# Patient Record
Sex: Male | Born: 1935
Health system: Southern US, Community
[De-identification: ages and names within clinical notes are randomized; demographics above are authoritative.]

## PROBLEM LIST (undated history)

## (undated) DIAGNOSIS — E785 Hyperlipidemia, unspecified: Secondary | ICD-10-CM

## (undated) DIAGNOSIS — M1990 Unspecified osteoarthritis, unspecified site: Secondary | ICD-10-CM

## (undated) DIAGNOSIS — S68119A Complete traumatic metacarpophalangeal amputation of unspecified finger, initial encounter: Secondary | ICD-10-CM

## (undated) DIAGNOSIS — Z87438 Personal history of other diseases of male genital organs: Secondary | ICD-10-CM

## (undated) DIAGNOSIS — G629 Polyneuropathy, unspecified: Secondary | ICD-10-CM

## (undated) DIAGNOSIS — I10 Essential (primary) hypertension: Secondary | ICD-10-CM

## (undated) DIAGNOSIS — R7303 Prediabetes: Secondary | ICD-10-CM

## (undated) DIAGNOSIS — I69391 Dysphagia following cerebral infarction: Secondary | ICD-10-CM

## (undated) DIAGNOSIS — C61 Malignant neoplasm of prostate: Secondary | ICD-10-CM

## (undated) DIAGNOSIS — C4490 Unspecified malignant neoplasm of skin, unspecified: Secondary | ICD-10-CM

## (undated) HISTORY — PX: FEMUR FRACTURE SURGERY: SHX633

## (undated) HISTORY — PX: KNEE ARTHROSCOPY: SUR90

## (undated) HISTORY — DX: Personal history of other diseases of male genital organs: Z87.438

## (undated) HISTORY — DX: Unspecified osteoarthritis, unspecified site: M19.90

## (undated) HISTORY — PX: OTHER SURGICAL HISTORY: SHX169

## (undated) HISTORY — PX: ROTATOR CUFF REPAIR: SHX139

## (undated) HISTORY — DX: Unspecified malignant neoplasm of skin, unspecified: C44.90

## (undated) HISTORY — DX: Essential (primary) hypertension: I10

## (undated) HISTORY — PX: FINGER AMPUTATION: SHX636

## (undated) HISTORY — DX: Polyneuropathy, unspecified: G62.9

## (undated) HISTORY — PX: HERNIA REPAIR: SHX51

## (undated) HISTORY — DX: Malignant neoplasm of prostate: C61

## (undated) HISTORY — PX: PROSTATE BIOPSY: SHX241

## (undated) HISTORY — DX: Hyperlipidemia, unspecified: E78.5

## (undated) HISTORY — PX: TRANSURETHRAL RESECTION OF PROSTATE: SHX73

## (undated) HISTORY — DX: Prediabetes: R73.03

## (undated) HISTORY — PX: INCISION / DRAINAGE HAND / FINGER: SUR695

## (undated) HISTORY — DX: Dysphagia following cerebral infarction: I69.391

## (undated) HISTORY — DX: Complete traumatic metacarpophalangeal amputation of unspecified finger, initial encounter: S68.119A

## (undated) HISTORY — PX: HYDROCELE EXCISION: SHX482

---

## 2000-04-09 ENCOUNTER — Encounter: Admission: RE | Admit: 2000-04-09 | Discharge: 2000-04-09 | Payer: Self-pay | Admitting: Internal Medicine

## 2000-04-09 ENCOUNTER — Encounter: Payer: Self-pay | Admitting: Internal Medicine

## 2001-05-09 ENCOUNTER — Encounter: Admission: RE | Admit: 2001-05-09 | Discharge: 2001-05-09 | Payer: Self-pay | Admitting: Internal Medicine

## 2001-05-09 ENCOUNTER — Encounter: Payer: Self-pay | Admitting: Internal Medicine

## 2001-12-30 ENCOUNTER — Ambulatory Visit (HOSPITAL_COMMUNITY): Admission: RE | Admit: 2001-12-30 | Discharge: 2001-12-30 | Payer: Self-pay | Admitting: *Deleted

## 2001-12-30 ENCOUNTER — Encounter (INDEPENDENT_AMBULATORY_CARE_PROVIDER_SITE_OTHER): Payer: Self-pay | Admitting: Specialist

## 2002-02-26 ENCOUNTER — Encounter: Admission: RE | Admit: 2002-02-26 | Discharge: 2002-02-26 | Payer: Self-pay | Admitting: Urology

## 2002-02-26 ENCOUNTER — Encounter: Payer: Self-pay | Admitting: Urology

## 2002-02-27 ENCOUNTER — Ambulatory Visit (HOSPITAL_BASED_OUTPATIENT_CLINIC_OR_DEPARTMENT_OTHER): Admission: RE | Admit: 2002-02-27 | Discharge: 2002-02-27 | Payer: Self-pay | Admitting: Urology

## 2004-02-09 ENCOUNTER — Ambulatory Visit (HOSPITAL_COMMUNITY): Admission: RE | Admit: 2004-02-09 | Discharge: 2004-02-09 | Payer: Self-pay | Admitting: Internal Medicine

## 2006-08-01 ENCOUNTER — Encounter: Admission: RE | Admit: 2006-08-01 | Discharge: 2006-08-01 | Payer: Self-pay | Admitting: Internal Medicine

## 2007-11-19 ENCOUNTER — Encounter: Admission: RE | Admit: 2007-11-19 | Discharge: 2007-11-19 | Payer: Self-pay | Admitting: Internal Medicine

## 2009-02-23 ENCOUNTER — Encounter: Admission: RE | Admit: 2009-02-23 | Discharge: 2009-02-23 | Payer: Self-pay | Admitting: Internal Medicine

## 2011-05-12 NOTE — Op Note (Signed)
[facility]  Patient:    Brandon Brown, Brandon Brown Visit Number: [id number] MRN: [id number]          Service Type: NES Location: [facility] Attending Physician:  Monica Becton Dictated by:   Claudette Laws, M.D. Proc. Date: 02/27/02 Admit Date:  02/27/2002                             Operative Report  PREOPERATIVE DIAGNOSIS:  Symptomatic right hydrocele.  POSTOPERATIVE DIAGNOSIS:  Symptomatic right hydrocele.  OPERATION:  Right hydrocelectomy (Lord procedure).  SURGEON:  Claudette Laws, M.D.  DESCRIPTION OF PROCEDURE:  The patient was prepped and draped in the supine position under LMA anesthesia.  A vessel-splitting right hemiscrotal incision was made.  The various scrotal layers were dissected out from the hydrocele sac which was then delivered through the incision.  We then opened the sac, aspirated approximately 100 cc of clear fluid.  Testicle was then brought out through the hydrocele sac.  It was inspected and appeared to be normal with no tumors, normal epididymis.  Then using 3-0 Vicryl, the hydrocele sac was embrocated in a serial fashion, obliterating the sac.  We then injected 20 cc of 0.25% plain Marcaine for a scrotal block, irrigated the wound out with copious amounts of saline, and then placed the testicle back into the hemiscrotum, care being taken not to cause a torsion.  The right hemiscrotum was then closed in layers with 2-0 Vicryl, and then the skin was closed with a 4-0 Monocryl subcuticular stitch.  A fluff dressing along with a scrotal support was applied, and the patient was then taken back to the recovery room in satisfactory condition. Dictated by:   Claudette Laws, M.D. Attending Physician:  Monica Becton DD:  02/27/02 TD:  02/27/02 Job: [id number]

## 2011-08-31 ENCOUNTER — Institutional Professional Consult (permissible substitution): Payer: Self-pay | Admitting: Cardiovascular Disease

## 2013-12-05 ENCOUNTER — Encounter: Payer: Self-pay | Admitting: Radiation Oncology

## 2013-12-05 DIAGNOSIS — C61 Malignant neoplasm of prostate: Secondary | ICD-10-CM | POA: Insufficient documentation

## 2013-12-05 NOTE — Progress Notes (Signed)
GU Location of Tumor / Histology: adenocarcinoma of the prostate  If Prostate Cancer, Gleason Score is (4 + 3=7) and PSA is (4.4 on 09/07/13). Prostate volume 13.4 cc.  Patient presented with elevated PSA and abnormal DRE.  Biopsies of prostate (if applicable) revealed:     Past/Anticipated interventions by urology, if any: TURP  Past/Anticipated interventions by medical oncology, if any: None  Weight changes, if any: None noted  Bowel/Bladder complaints, if any: obstructive symptoms have improved since TURP   Nausea/Vomiting, if any: None noted  Pain issues, if any:  None noted  SAFETY ISSUES:  Prior radiation? NO  Pacemaker/ICD? NO  Possible current pregnancy? NO  Is the patient on methotrexate? NO  Current Complaints / other details:  77 year old male. Dr. Vernie Ammons explained to the patient he is not a good surgical candidate. Ottelin encouraged radioactive seeds.

## 2013-12-07 ENCOUNTER — Encounter: Payer: Self-pay | Admitting: Radiation Oncology

## 2013-12-07 NOTE — Progress Notes (Addendum)
Radiation Oncology         [phone] ________________________________  Initial outpatient Consultation  Name: Brandon Brown MRN: [id number]  Date: 12/08/2013  DOB: [birth year]  CC:No primary provider on file.  Garnett Farm, MD   REFERRING PHYSICIAN: Garnett Farm, MD  DIAGNOSIS: 77 y.o. gentleman with stage T2b adenocarcinoma of the prostate with a Gleason's score of 4+3 and a PSA of 4.4  HISTORY OF PRESENT ILLNESS::Brandon Brown is a 77 y.o. gentleman with a history of BPH s/p TURP in 12/13.  In follow-up with Dr. Teressa Senter in [place], he was noted to have a rising PSA from 3.2 up to 4.4.  He was also noted to have a nodule on DRE.  Dr. Salvatore Decent recommended biopsy, but, the patient asked to get a second opinion from his primary care physician, Dr. Leonia Reader.  Accordingly, he was referred for evaluation in urology by Dr. Vernie Ammons on 10/16/13,  digital rectal examination was performed at that time revealing 1+ gland with a palpable nodule involving the right mid aspect..  The patient proceeded to transrectal ultrasound with 12 biopsies of the prostate on 11/12/12.  The prostate volume measured 13.38 cc.  Of note, his TURP defect was noted on TRUS.  Out of 12 core biopsies, 6 were positive.  The maximum Gleason score was 4+3, and this was seen in the areas displayed below.   The patient reviewed the biopsy results with his urologist and he has kindly been referred today for discussion of potential radiation treatment options.  PREVIOUS RADIATION THERAPY: No  PAST MEDICAL HISTORY:  has a past medical history of Prostate cancer; Hypertension; Hyperlipidemia; and prostatitis.    PAST SURGICAL HISTORY: Past Surgical History  Procedure Laterality Date  . Knee arthroscopy    . Femur fracture surgery    . Incision / drainage hand / finger    . Hernia repair    . Rotator cuff repair    . Prostate biopsy    . Tunica vaginalis excision of hydrocele right    . Transurethral resection of  prostate    . Transurethral resection of prostate      FAMILY HISTORY: family history includes Heart disease in his mother; Non-Hodgkin's lymphoma in his mother.  SOCIAL HISTORY:  reports that he has never smoked. He has never used smokeless tobacco. He reports that he does not drink alcohol or use illicit drugs.  ALLERGIES: Penicillins and Statins  MEDICATIONS:  Current Outpatient Prescriptions  Medication Sig Dispense Refill  . aspirin 81 MG tablet Take 81 mg by mouth daily.      Marland Kitchen lisinopril (PRINIVIL,ZESTRIL) 30 MG tablet Take 40 mg by mouth daily.       . Omega-3 Fatty Acids (FISH OIL) 1000 MG CAPS Take by mouth.      . pravastatin (PRAVACHOL) 20 MG tablet Take 20 mg by mouth daily.      . sildenafil (VIAGRA) 100 MG tablet Take 100 mg by mouth daily as needed for erectile dysfunction.       No current facility-administered medications for this encounter.    REVIEW OF SYSTEMS:  A 15 point review of systems is documented in the electronic medical record. This was obtained by the nursing staff. However, I reviewed this with the patient to discuss relevant findings and make appropriate changes.  A comprehensive review of systems was negative..  The patient completed an IPSS and IIEF questionnaire.  His IPSS score was 5 indicating mild urinary outflow obstructive symptoms.  He indicated that his erectile function is not a high priority in decision making.   PHYSICAL EXAM: This patient is in no acute distress.  He is alert and oriented.   height is 6\' 1"  (1.854 m) and weight is 192 lb (87.091 kg). His oral temperature is 97.6 F (36.4 C). His blood pressure is 158/72 and his pulse is 72. His respiration is 16 and oxygen saturation is 100%.  He exhibits no respiratory distress or labored breathing.  He appears neurologically intact.  His mood is pleasant.  His affect is appropriate.  Please note the digital rectal exam findings described above.  KPS = 100  100 - Normal; no complaints; no  evidence of disease. 90   - Able to carry on normal activity; minor signs or symptoms of disease. 80   - Normal activity with effort; some signs or symptoms of disease. 24   - Cares for self; unable to carry on normal activity or to do active work. 60   - Requires occasional assistance, but is able to care for most of his personal needs. 50   - Requires considerable assistance and frequent medical care. 40   - Disabled; requires special care and assistance. 30   - Severely disabled; hospital admission is indicated although death not imminent. 20   - Very sick; hospital admission necessary; active supportive treatment necessary. 10   - Moribund; fatal processes progressing rapidly. 0     - Dead  Karnofsky DA, Abelmann WH, Craver LS and Burchenal JH [phone]) The use of the nitrogen mustards in the palliative treatment of carcinoma: with particular reference to bronchogenic carcinoma Cancer 1 634-56   LABORATORY DATA:  No results found for this basename: WBC,  HGB,  HCT,  MCV,  PLT   No results found for this basename: NA,  K,  CL,  CO2   No results found for this basename: ALT,  AST,  GGT,  ALKPHOS,  BILITOT     RADIOGRAPHY: No results found.    IMPRESSION: This gentleman is a 77 y.o. gentleman with stage T2b adenocarcinoma of the prostate with a Gleason's score of 4+3 and a PSA of 4.4.  His T-Stage, Gleason's Score, and PSA put him into the higher risk intermediate risk group.  Accordingly he is eligible for a variety of potential treatment options including androgen deprivation with radiotherapy with or without seed boost.  In general, I tend to recommend seed implant boost to achieve a higher dose levels then in intensity modulated radiotherapy can achieve. However, from a technical perspective, I do have a few reservations about prostate seed implant for [name]. Specifically, the volume of the patient's gland is less than 15 cc which can create challenges in designing a high quality seed  implant with proper coverage of the entire prostate while sparing the urethra and rectum. My second reservation is the patient's previous TURP procedure. While most literature suggests that seed implant more than 6 months following TURP does not increase risk of incontinence, I am concerned that the TURP defect remains visible on transrectal ultrasound further decreasing the volume of prostate parenchyma available for seed placement. Neither of these 2 concerns represent contraindications, so I need to discuss with the patient the pros and cons of external radiation using seed implant boost versus intensity modulated radiotherapy alone.Marland Kitchen  PLAN:Today I reviewed the findings and workup thus far.  We discussed the natural history of prostate cancer.  We reviewed the the implications of T-stage, Gleason's Score,  and PSA on decision-making and outcomes in prostate cancer.  We discussed radiation treatment in the management of prostate cancer with regard to the logistics and delivery of external beam radiation treatment as well as the logistics and delivery of prostate brachytherapy.  We compared and contrasted each of these approaches and also compared these against prostatectomy.  The patient expressed interest in external beam radiotherapy.  I filled out a patient counseling form for him with relevant treatment diagrams and we retained a copy for our records.   The patient would like to proceed with prostate IMRT.  I will share my findings with Dr. Vernie Ammons and move forward with scheduling hormone therapy followed by placement of three gold fiducial markers into the prostate to proceed with IMRT in the near future.     I enjoyed meeting with him today, and will look forward to participating in the care of this very nice gentleman.   I spent 60 minutes face to face with the patient and more than 50% of that time was spent in counseling and/or coordination of care.     ------------------------------------------------  Artist Pais. Kathrynn Running, M.D.

## 2013-12-08 ENCOUNTER — Encounter: Payer: Self-pay | Admitting: Radiation Oncology

## 2013-12-08 ENCOUNTER — Ambulatory Visit
Admission: RE | Admit: 2013-12-08 | Discharge: 2013-12-08 | Disposition: A | Discharge status: Home / Self Care | Payer: 59 | Source: Ambulatory Visit | Attending: Radiation Oncology | Admitting: Radiation Oncology

## 2013-12-08 ENCOUNTER — Telehealth: Payer: Self-pay | Admitting: *Deleted

## 2013-12-08 ENCOUNTER — Ambulatory Visit
Admission: RE | Admit: 2013-12-08 | Discharge: 2013-12-08 | Disposition: A | Discharge status: Home / Self Care | Payer: Medicare Other | Source: Ambulatory Visit | Attending: Radiation Oncology | Admitting: Radiation Oncology

## 2013-12-08 VITALS — BP 158/72 | HR 72 | Temp 97.6°F | Resp 16 | Ht 73.0 in | Wt 192.0 lb

## 2013-12-08 DIAGNOSIS — C61 Malignant neoplasm of prostate: Secondary | ICD-10-CM | POA: Insufficient documentation

## 2013-12-08 DIAGNOSIS — I10 Essential (primary) hypertension: Secondary | ICD-10-CM | POA: Insufficient documentation

## 2013-12-08 DIAGNOSIS — Z807 Family history of other malignant neoplasms of lymphoid, hematopoietic and related tissues: Secondary | ICD-10-CM | POA: Insufficient documentation

## 2013-12-08 DIAGNOSIS — E785 Hyperlipidemia, unspecified: Secondary | ICD-10-CM | POA: Insufficient documentation

## 2013-12-08 DIAGNOSIS — Z79899 Other long term (current) drug therapy: Secondary | ICD-10-CM | POA: Insufficient documentation

## 2013-12-08 NOTE — Addendum Note (Signed)
Encounter addended by: Oneita Hurt, MD on: 12/08/2013 10:28 AM<BR>     Documentation filed: Clinical Notes, Notes Section

## 2013-12-08 NOTE — Progress Notes (Signed)
See progress note under physician encounter. 

## 2013-12-08 NOTE — Addendum Note (Signed)
Encounter addended by: Oneita Hurt, MD on: 12/08/2013 10:30 AM<BR>     Documentation filed: Clinical Notes

## 2013-12-08 NOTE — Progress Notes (Signed)
IPSS of 5 noted. Reports on average he void once during the night. Patient denies dysuria or hematuria. States, "however, if I drink a beer and urinate i do feel a little burning sensation." Denies urgency or incontinence. Denies unintentional weight loss or decreased appetite. Denies fatigue. Reports that he works out at [employer] several times a week. Patient is a former football, Writer. Denies diarrhea. Denies pain associated with bowel movements or blood in stool.

## 2013-12-08 NOTE — Progress Notes (Signed)
Complete PATIENT MEASURE OF DISTRESS worksheet with a score of 8 submitted to social work.

## 2013-12-08 NOTE — Telephone Encounter (Signed)
Called patient to inform of hormone shot for 12-09-13, and his gold seed placement on 02-09-14 at 1:15 pm at Dr. Margrett Rud [facility] and his sim on 02/20/14 at 9:00 am at Dr. Broadus John [facility], spoke with patient and he is aware of these appts.

## 2013-12-10 ENCOUNTER — Ambulatory Visit: Payer: Medicare Other

## 2013-12-10 ENCOUNTER — Ambulatory Visit: Payer: Medicare Other | Admitting: Radiation Oncology

## 2013-12-22 ENCOUNTER — Encounter: Payer: Self-pay | Admitting: *Deleted

## 2013-12-22 NOTE — Progress Notes (Addendum)
[facility] Psychosocial Distress Screening  Clinical Social Work  Clinical Social Work was referred by distress screening protocol. The patient scored a 8 on the Psychosocial Distress Thermometer which indicates severe distress. Clinical Social Worker attempted to contact patient by phone to assess for distress and other psychosocial needs. CSW unable to reach patient, left voicemail to return call when convenient.   Patient returned CSW phone call.  Mr. Capri states he is feeling confident in "next steps", he discussed feeling anxious about new treatment and uncertainty (not knowing what to expect).  CSW validated patient's feelings and encouraged him to attend the prostate cancer support group.  CSW discussed support services at center.  Patient was very appreciative of phone contact and plans to follow up with CSW as needed.  Kathrin Penner, MSW, LCSW, OSW-C  Clinical Social Worker  [facility]  [phone]

## 2014-01-27 ENCOUNTER — Telehealth: Payer: Self-pay | Admitting: Radiation Oncology

## 2014-01-27 NOTE — Telephone Encounter (Signed)
Placed purple folder with patient's daughter's FMLA form in Dr. Johny Shears inbox for signature.

## 2014-02-03 ENCOUNTER — Encounter: Payer: Self-pay | Admitting: Radiation Oncology

## 2014-02-03 NOTE — Progress Notes (Signed)
Faxed FMLA paperwork - scanned.

## 2014-02-20 ENCOUNTER — Ambulatory Visit
Admission: RE | Admit: 2014-02-20 | Discharge: 2014-02-20 | Disposition: A | Discharge status: Home / Self Care | Payer: Medicare Other | Source: Ambulatory Visit | Attending: Radiation Oncology | Admitting: Radiation Oncology

## 2014-02-20 DIAGNOSIS — C61 Malignant neoplasm of prostate: Secondary | ICD-10-CM | POA: Insufficient documentation

## 2014-02-20 DIAGNOSIS — Z510 Encounter for antineoplastic radiation therapy: Secondary | ICD-10-CM | POA: Insufficient documentation

## 2014-02-20 DIAGNOSIS — Z79899 Other long term (current) drug therapy: Secondary | ICD-10-CM | POA: Insufficient documentation

## 2014-02-20 DIAGNOSIS — Z7982 Long term (current) use of aspirin: Secondary | ICD-10-CM | POA: Insufficient documentation

## 2014-02-21 NOTE — Progress Notes (Signed)
  Radiation Oncology         [phone] ________________________________  Name: Brandon Brown MRN: [id number]  Date: 02/20/2014  DOB: [birth year]  SIMULATION AND TREATMENT PLANNING NOTE  DIAGNOSIS:  78 y.o. gentleman with stage T2b adenocarcinoma of the prostate with a Gleason's score of 4+3 and a PSA of 4.4  NARRATIVE:  The patient was brought to the [place] [facility].  Identity was confirmed.  All relevant records and images related to the planned course of therapy were reviewed.  The patient freely provided informed written consent to proceed with treatment after reviewing the details related to the planned course of therapy. The consent form was witnessed and verified by the simulation staff.  Then, the patient was set-up in a stable reproducible supine position for radiation therapy.  A vacuum lock pillow device was custom fabricated to position his legs in a reproducible immobilized position.  Then, I performed a urethrogram under sterile conditions to identify the prostatic apex.  CT images were obtained.  Surface markings were placed.  The CT images were loaded into the planning software.  Then the prostate target and avoidance structures including the rectum, bladder, bowel and hips were contoured.  Treatment planning then occurred.  The radiation prescription was entered and confirmed.  A total of one complex treatment devices were fabricated in the form of a bodyFix leg mold. I have requested : Intensity Modulated Radiotherapy (IMRT) is medically necessary for this case for the following reason:  Rectal sparing.Marland Kitchen  PLAN:  The patient will receive 78 Gy in 40 fractions.  ________________________________  Sheral Apley Tammi Klippel, M.D.

## 2014-03-02 ENCOUNTER — Ambulatory Visit
Admission: RE | Admit: 2014-03-02 | Discharge: 2014-03-02 | Disposition: A | Discharge status: Home / Self Care | Payer: Medicare Other | Source: Ambulatory Visit | Attending: Radiation Oncology | Admitting: Radiation Oncology

## 2014-03-02 DIAGNOSIS — C61 Malignant neoplasm of prostate: Secondary | ICD-10-CM

## 2014-03-03 ENCOUNTER — Ambulatory Visit
Admission: RE | Admit: 2014-03-03 | Discharge: 2014-03-03 | Disposition: A | Discharge status: Home / Self Care | Payer: Medicare Other | Source: Ambulatory Visit | Attending: Radiation Oncology | Admitting: Radiation Oncology

## 2014-03-04 ENCOUNTER — Ambulatory Visit
Admission: RE | Admit: 2014-03-04 | Discharge: 2014-03-04 | Disposition: A | Discharge status: Home / Self Care | Payer: Medicare Other | Source: Ambulatory Visit | Attending: Radiation Oncology | Admitting: Radiation Oncology

## 2014-03-05 ENCOUNTER — Ambulatory Visit
Admission: RE | Admit: 2014-03-05 | Discharge: 2014-03-05 | Disposition: A | Discharge status: Home / Self Care | Payer: Medicare Other | Source: Ambulatory Visit | Attending: Radiation Oncology | Admitting: Radiation Oncology

## 2014-03-06 ENCOUNTER — Ambulatory Visit
Admission: RE | Admit: 2014-03-06 | Discharge: 2014-03-06 | Disposition: A | Discharge status: Home / Self Care | Payer: Medicare Other | Source: Ambulatory Visit | Attending: Radiation Oncology | Admitting: Radiation Oncology

## 2014-03-06 VITALS — BP 182/89 | HR 78 | Temp 97.9°F | Ht 73.0 in | Wt 195.0 lb

## 2014-03-06 DIAGNOSIS — C61 Malignant neoplasm of prostate: Secondary | ICD-10-CM

## 2014-03-06 NOTE — Progress Notes (Signed)
Brandon Brown has had 5 fractions to his prostate.  He denies pain.  His bp is elevated today at 182/89.  He says he gets nervous when he is here.  He denies hematuria and dysuria.  He reports urinating usually 2 times per night.  He reports hot flashes at night.  He had a script for megace but did not take it due to the side effects.  He was given the Radiation Therapy and You book and discussed side effects/managent of fatigue, diarrhea, skin changes and bladder changes.

## 2014-03-07 ENCOUNTER — Encounter: Payer: Self-pay | Admitting: Radiation Oncology

## 2014-03-07 NOTE — Progress Notes (Signed)
  Radiation Oncology         [phone] ________________________________  Name: Brandon Brown MRN: [id number]  Date: 03/06/2014  DOB: 05-26-1936  Weekly Radiation Therapy Management  Current Dose: 9.75 Gy     Planned Dose:  78 Gy  Narrative . . . . . . . . The patient presents for routine under treatment assessment.                                   The patient is without complaint.                                 Set-up films were reviewed.                                 The chart was checked. Physical Findings. . .  height is 6\' 1"  (1.854 m) and weight is 195 lb (88.451 kg). His temperature is 97.9 F (36.6 C). His blood pressure is 182/89 and his pulse is 78. . Weight essentially stable.  No significant changes. Impression . . . . . . . The patient is tolerating radiation. Plan . . . . . . . . . . . . Continue treatment as planned.  ________________________________  Sheral Apley. Tammi Klippel, M.D.

## 2014-03-09 ENCOUNTER — Ambulatory Visit
Admission: RE | Admit: 2014-03-09 | Discharge: 2014-03-09 | Disposition: A | Discharge status: Home / Self Care | Payer: Medicare Other | Source: Ambulatory Visit | Attending: Radiation Oncology | Admitting: Radiation Oncology

## 2014-03-10 ENCOUNTER — Ambulatory Visit
Admission: RE | Admit: 2014-03-10 | Discharge: 2014-03-10 | Disposition: A | Discharge status: Home / Self Care | Payer: Medicare Other | Source: Ambulatory Visit | Attending: Radiation Oncology | Admitting: Radiation Oncology

## 2014-03-11 ENCOUNTER — Ambulatory Visit
Admission: RE | Admit: 2014-03-11 | Discharge: 2014-03-11 | Disposition: A | Discharge status: Home / Self Care | Payer: Medicare Other | Source: Ambulatory Visit | Attending: Radiation Oncology | Admitting: Radiation Oncology

## 2014-03-12 ENCOUNTER — Encounter: Payer: Self-pay | Admitting: Radiation Oncology

## 2014-03-12 ENCOUNTER — Ambulatory Visit
Admission: RE | Admit: 2014-03-12 | Discharge: 2014-03-12 | Disposition: A | Discharge status: Home / Self Care | Payer: Medicare Other | Source: Ambulatory Visit | Attending: Radiation Oncology | Admitting: Radiation Oncology

## 2014-03-12 VITALS — BP 136/74 | HR 66 | Temp 98.3°F | Resp 20 | Wt 197.1 lb

## 2014-03-12 DIAGNOSIS — C61 Malignant neoplasm of prostate: Secondary | ICD-10-CM

## 2014-03-12 NOTE — Progress Notes (Signed)
Pt denies pain, urinary/bowel issues, fatigue, loss of appetite. 

## 2014-03-13 ENCOUNTER — Encounter: Payer: Self-pay | Admitting: Radiation Oncology

## 2014-03-13 ENCOUNTER — Ambulatory Visit
Admission: RE | Admit: 2014-03-13 | Discharge: 2014-03-13 | Disposition: A | Discharge status: Home / Self Care | Payer: Medicare Other | Source: Ambulatory Visit | Attending: Radiation Oncology | Admitting: Radiation Oncology

## 2014-03-13 NOTE — Progress Notes (Signed)
  Radiation Oncology         [phone] ________________________________  Name: Brandon Brown MRN: [id number]  Date: 03/12/2014  DOB: 1936-08-08  Weekly Radiation Therapy Management  Current Dose: 15.6 Gy     Planned Dose:  78 Gy  Narrative . . . . . . . . The patient presents for routine under treatment assessment.                                   The patient is without complaint.                                 Set-up films were reviewed.                                 The chart was checked. Physical Findings. . .  weight is 197 lb 1.6 oz (89.404 kg). His oral temperature is 98.3 F (36.8 C). His blood pressure is 136/74 and his pulse is 66. His respiration is 20. . Weight essentially stable.  No significant changes. Impression . . . . . . . The patient is tolerating radiation. Plan . . . . . . . . . . . . Continue treatment as planned.  ________________________________  Sheral Apley. Tammi Klippel, M.D.

## 2014-03-16 ENCOUNTER — Ambulatory Visit
Admission: RE | Admit: 2014-03-16 | Discharge: 2014-03-16 | Disposition: A | Discharge status: Home / Self Care | Payer: Medicare Other | Source: Ambulatory Visit | Attending: Radiation Oncology | Admitting: Radiation Oncology

## 2014-03-17 ENCOUNTER — Ambulatory Visit
Admission: RE | Admit: 2014-03-17 | Discharge: 2014-03-17 | Disposition: A | Discharge status: Home / Self Care | Payer: Medicare Other | Source: Ambulatory Visit | Attending: Radiation Oncology | Admitting: Radiation Oncology

## 2014-03-18 ENCOUNTER — Ambulatory Visit
Admission: RE | Admit: 2014-03-18 | Discharge: 2014-03-18 | Disposition: A | Discharge status: Home / Self Care | Payer: Medicare Other | Source: Ambulatory Visit | Attending: Radiation Oncology | Admitting: Radiation Oncology

## 2014-03-19 ENCOUNTER — Encounter: Payer: Self-pay | Admitting: Radiation Oncology

## 2014-03-19 ENCOUNTER — Ambulatory Visit
Admission: RE | Admit: 2014-03-19 | Discharge: 2014-03-19 | Disposition: A | Discharge status: Home / Self Care | Payer: Medicare Other | Source: Ambulatory Visit | Attending: Radiation Oncology | Admitting: Radiation Oncology

## 2014-03-19 NOTE — Progress Notes (Signed)
  Radiation Oncology         [phone] ________________________________  Name: BRADDEN TADROS MRN: [id number]  Date: 03/20/2014  DOB: 1936/09/11  Weekly Radiation Therapy Management  Current Dose: 29.95 Gy     Planned Dose:  78 Gy  Narrative . . . . . . . . The patient presents for routine under treatment assessment.                                 Weight stable. Reports an excellent appetite. Reports an occasional twinge but, is unsure if it prostate or rectal. Denies dysuria or hematuria. Denies diarrhea. Denies fatigue. Reports a steady urine stream.                                 Set-up films were reviewed.                                 The chart was checked. Physical Findings. . .  weight is 197 lb 8 oz (89.585 kg). His blood pressure is 136/77 and his pulse is 73. His respiration is 18. . Weight essentially stable.  No significant changes. Impression . . . . . . . The patient is tolerating radiation. Plan . . . . . . . . . . . . Continue treatment as planned.  ________________________________  Sheral Apley. Tammi Klippel, M.D.

## 2014-03-20 ENCOUNTER — Ambulatory Visit
Admission: RE | Admit: 2014-03-20 | Discharge: 2014-03-20 | Disposition: A | Discharge status: Home / Self Care | Payer: Medicare Other | Source: Ambulatory Visit | Attending: Radiation Oncology | Admitting: Radiation Oncology

## 2014-03-20 ENCOUNTER — Encounter: Payer: Self-pay | Admitting: Radiation Oncology

## 2014-03-20 VITALS — BP 136/77 | HR 73 | Resp 18 | Wt 197.5 lb

## 2014-03-20 DIAGNOSIS — C61 Malignant neoplasm of prostate: Secondary | ICD-10-CM

## 2014-03-20 NOTE — Progress Notes (Signed)
Weight stable. Reports an excellent appetite. Reports an occasional twinge but, is unsure if it prostate or rectal. Denies dysuria or hematuria. Denies diarrhea. Denies fatigue. Reports a steady urine stream.

## 2014-03-23 ENCOUNTER — Ambulatory Visit
Admission: RE | Admit: 2014-03-23 | Discharge: 2014-03-23 | Disposition: A | Discharge status: Home / Self Care | Payer: Medicare Other | Source: Ambulatory Visit | Attending: Radiation Oncology | Admitting: Radiation Oncology

## 2014-03-24 ENCOUNTER — Ambulatory Visit
Admission: RE | Admit: 2014-03-24 | Discharge: 2014-03-24 | Disposition: A | Discharge status: Home / Self Care | Payer: Medicare Other | Source: Ambulatory Visit | Attending: Radiation Oncology | Admitting: Radiation Oncology

## 2014-03-24 ENCOUNTER — Encounter: Payer: Self-pay | Admitting: Radiation Oncology

## 2014-03-24 VITALS — BP 108/73 | HR 104 | Resp 16 | Wt 196.0 lb

## 2014-03-24 DIAGNOSIS — C61 Malignant neoplasm of prostate: Secondary | ICD-10-CM

## 2014-03-24 NOTE — Progress Notes (Addendum)
Weight stable. Reports an excellent appetite. Reports an occasional twinge but, is unsure if it prostate or rectal. Denies dysuria. Reports scant blood on the tissue when he wiped following a bowel movement. Denies diarrhea. Denies fatigue. Reports a steady urine stream.

## 2014-03-24 NOTE — Progress Notes (Signed)
Weekly Management Note Current Dose:  33.15 Gy  Projected Dose: 78 Gy   Narrative:  The patient presents for routine under treatment assessment.  CBCT/MVCT images/Port film x-rays were reviewed.  The chart was checked. Stable urinary symptoms. No diarrhea. Energy levels good.  Physical Findings: Weight: 196 lb (88.905 kg). Alert and oriented.  Impression:  The patient is tolerating radiation.  Plan:  Continue treatment as planned.

## 2014-03-25 ENCOUNTER — Ambulatory Visit
Admission: RE | Admit: 2014-03-25 | Discharge: 2014-03-25 | Disposition: A | Discharge status: Home / Self Care | Payer: Medicare Other | Source: Ambulatory Visit | Attending: Radiation Oncology | Admitting: Radiation Oncology

## 2014-03-26 ENCOUNTER — Ambulatory Visit
Admission: RE | Admit: 2014-03-26 | Discharge: 2014-03-26 | Disposition: A | Discharge status: Home / Self Care | Payer: Medicare Other | Source: Ambulatory Visit | Attending: Radiation Oncology | Admitting: Radiation Oncology

## 2014-03-27 ENCOUNTER — Ambulatory Visit
Admission: RE | Admit: 2014-03-27 | Discharge: 2014-03-27 | Disposition: A | Discharge status: Home / Self Care | Payer: Medicare Other | Source: Ambulatory Visit | Attending: Radiation Oncology | Admitting: Radiation Oncology

## 2014-03-30 ENCOUNTER — Ambulatory Visit
Admission: RE | Admit: 2014-03-30 | Discharge: 2014-03-30 | Disposition: A | Discharge status: Home / Self Care | Payer: Medicare Other | Source: Ambulatory Visit | Attending: Radiation Oncology | Admitting: Radiation Oncology

## 2014-03-31 ENCOUNTER — Ambulatory Visit
Admission: RE | Admit: 2014-03-31 | Discharge: 2014-03-31 | Disposition: A | Discharge status: Home / Self Care | Payer: Medicare Other | Source: Ambulatory Visit | Attending: Radiation Oncology | Admitting: Radiation Oncology

## 2014-04-01 ENCOUNTER — Ambulatory Visit
Admission: RE | Admit: 2014-04-01 | Discharge: 2014-04-01 | Disposition: A | Discharge status: Home / Self Care | Payer: Medicare Other | Source: Ambulatory Visit | Attending: Radiation Oncology | Admitting: Radiation Oncology

## 2014-04-02 ENCOUNTER — Ambulatory Visit
Admission: RE | Admit: 2014-04-02 | Discharge: 2014-04-02 | Disposition: A | Discharge status: Home / Self Care | Payer: Medicare Other | Source: Ambulatory Visit | Attending: Radiation Oncology | Admitting: Radiation Oncology

## 2014-04-03 ENCOUNTER — Ambulatory Visit: Payer: Medicare Other | Admitting: Radiation Oncology

## 2014-04-03 ENCOUNTER — Ambulatory Visit: Payer: Medicare Other

## 2014-04-06 ENCOUNTER — Ambulatory Visit
Admission: RE | Admit: 2014-04-06 | Discharge: 2014-04-06 | Disposition: A | Discharge status: Home / Self Care | Payer: Medicare Other | Source: Ambulatory Visit | Attending: Radiation Oncology | Admitting: Radiation Oncology

## 2014-04-06 ENCOUNTER — Encounter: Payer: Self-pay | Admitting: Radiation Oncology

## 2014-04-06 VITALS — BP 149/63 | HR 91 | Temp 97.8°F | Ht 73.0 in | Wt 197.2 lb

## 2014-04-06 DIAGNOSIS — C61 Malignant neoplasm of prostate: Secondary | ICD-10-CM

## 2014-04-06 NOTE — Progress Notes (Signed)
  Radiation Oncology         [phone] ________________________________  Name: Brandon Brown MRN: [id number]  Date: 04/06/2014  DOB: 1936-11-24  Weekly Radiation Therapy Management  Current Fraction: 25     Planned Fractions:  40  Narrative . . . . . . . . The patient presents for routine under treatment assessment.                                   The patient is without complaint.                                 Set-up films were reviewed.                                 The chart was checked. Physical Findings. . .  height is 6\' 1"  (1.854 m) and weight is 197 lb 3.2 oz (89.449 kg). His temperature is 97.8 F (36.6 C). His blood pressure is 149/63 and his pulse is 91. . Weight essentially stable.  No significant changes. Impression . . . . . . . The patient is tolerating radiation. Plan . . . . . . . . . . . . Continue treatment as planned.  ________________________________  Sheral Apley. Tammi Klippel, M.D.

## 2014-04-06 NOTE — Progress Notes (Addendum)
Brandon Brown has received 25 fractions to his pelvis for prostate cancer.  He denies any frequency, urgency, dysuria, proctitis, nor diarrhea.  No other voiced concerns.

## 2014-04-07 ENCOUNTER — Ambulatory Visit
Admission: RE | Admit: 2014-04-07 | Discharge: 2014-04-07 | Disposition: A | Discharge status: Home / Self Care | Payer: Medicare Other | Source: Ambulatory Visit | Attending: Radiation Oncology | Admitting: Radiation Oncology

## 2014-04-08 ENCOUNTER — Ambulatory Visit
Admission: RE | Admit: 2014-04-08 | Discharge: 2014-04-08 | Disposition: A | Discharge status: Home / Self Care | Payer: Medicare Other | Source: Ambulatory Visit | Attending: Radiation Oncology | Admitting: Radiation Oncology

## 2014-04-09 ENCOUNTER — Encounter: Payer: Self-pay | Admitting: Radiation Oncology

## 2014-04-09 ENCOUNTER — Ambulatory Visit
Admission: RE | Admit: 2014-04-09 | Discharge: 2014-04-09 | Disposition: A | Discharge status: Home / Self Care | Payer: Medicare Other | Source: Ambulatory Visit | Attending: Radiation Oncology | Admitting: Radiation Oncology

## 2014-04-09 VITALS — BP 154/67 | HR 79 | Temp 97.8°F | Wt 199.4 lb

## 2014-04-09 DIAGNOSIS — C61 Malignant neoplasm of prostate: Secondary | ICD-10-CM

## 2014-04-09 NOTE — Progress Notes (Signed)
Patient for reassessment in new block this week. Completed 28 of 40 treatments of pelvis for prostate cancer.Has normal urine flow during the day and had nocturia up  3 times last night.denies pain.does feel warm sensation over pelvis at times.

## 2014-04-09 NOTE — Progress Notes (Signed)
  Radiation Oncology         [phone] ________________________________  Name: Brandon VALLETTA MRN: [id number]  Date: 04/09/2014  DOB: 05-06-1936  Weekly Radiation Therapy Management  Current Dose: 54.6 Gy     Planned Dose:  78 Gy  Narrative . . . . . . . . The patient presents for routine under treatment assessment.                                   Patient for reassessment in new block this week. Completed 28 of 40 treatments of pelvis for prostate cancer.Has normal urine flow during the day and had nocturia up 3 times last night.denies pain.does feel warm sensation over pelvis at times.                                   Set-up films were reviewed.                                 The chart was checked. Physical Findings. . .  weight is 199 lb 6.4 oz (90.447 kg). His temperature is 97.8 F (36.6 C). His blood pressure is 154/67 and his pulse is 79. . Weight essentially stable.  No significant changes. Impression . . . . . . . The patient is tolerating radiation. Plan . . . . . . . . . . . . Continue treatment as planned.  ________________________________  Sheral Apley. Tammi Klippel, M.D.

## 2014-04-10 ENCOUNTER — Ambulatory Visit
Admission: RE | Admit: 2014-04-10 | Discharge: 2014-04-10 | Disposition: A | Discharge status: Home / Self Care | Payer: Medicare Other | Source: Ambulatory Visit | Attending: Radiation Oncology | Admitting: Radiation Oncology

## 2014-04-13 ENCOUNTER — Ambulatory Visit
Admission: RE | Admit: 2014-04-13 | Discharge: 2014-04-13 | Disposition: A | Discharge status: Home / Self Care | Payer: Medicare Other | Source: Ambulatory Visit | Attending: Radiation Oncology | Admitting: Radiation Oncology

## 2014-04-14 ENCOUNTER — Ambulatory Visit
Admission: RE | Admit: 2014-04-14 | Discharge: 2014-04-14 | Disposition: A | Discharge status: Home / Self Care | Payer: Medicare Other | Source: Ambulatory Visit | Attending: Radiation Oncology | Admitting: Radiation Oncology

## 2014-04-15 ENCOUNTER — Ambulatory Visit
Admission: RE | Admit: 2014-04-15 | Discharge: 2014-04-15 | Disposition: A | Discharge status: Home / Self Care | Payer: Medicare Other | Source: Ambulatory Visit | Attending: Radiation Oncology | Admitting: Radiation Oncology

## 2014-04-16 ENCOUNTER — Ambulatory Visit
Admission: RE | Admit: 2014-04-16 | Discharge: 2014-04-16 | Disposition: A | Discharge status: Home / Self Care | Payer: Medicare Other | Source: Ambulatory Visit | Attending: Radiation Oncology | Admitting: Radiation Oncology

## 2014-04-16 ENCOUNTER — Encounter: Payer: Self-pay | Admitting: Radiation Oncology

## 2014-04-16 DIAGNOSIS — C61 Malignant neoplasm of prostate: Secondary | ICD-10-CM

## 2014-04-17 ENCOUNTER — Ambulatory Visit: Payer: Medicare Other

## 2014-04-20 ENCOUNTER — Ambulatory Visit
Admission: RE | Admit: 2014-04-20 | Discharge: 2014-04-20 | Disposition: A | Discharge status: Home / Self Care | Payer: Medicare Other | Source: Ambulatory Visit | Attending: Radiation Oncology | Admitting: Radiation Oncology

## 2014-04-20 ENCOUNTER — Encounter: Payer: Self-pay | Admitting: Radiation Oncology

## 2014-04-20 VITALS — BP 147/72 | HR 65 | Resp 16 | Wt 201.6 lb

## 2014-04-20 DIAGNOSIS — C61 Malignant neoplasm of prostate: Secondary | ICD-10-CM

## 2014-04-20 NOTE — Progress Notes (Signed)
   Department of Radiation Oncology  Phone:  [phone] Fax:        [phone]  Weekly Treatment Note    Name: Brandon Brown Date: 04/20/2014 MRN: [id number] DOB: 1936-10-08   Current dose: 66.3 Gy  Current fraction: 34   MEDICATIONS: Current Outpatient Prescriptions  Medication Sig Dispense Refill  . aspirin 81 MG tablet Take 81 mg by mouth daily.      Marland Kitchen lisinopril (PRINIVIL,ZESTRIL) 30 MG tablet Take 40 mg by mouth daily.       . Omega-3 Fatty Acids (FISH OIL) 1000 MG CAPS Take by mouth.      . pravastatin (PRAVACHOL) 20 MG tablet       . sildenafil (VIAGRA) 100 MG tablet Take 100 mg by mouth daily as needed for erectile dysfunction.       No current facility-administered medications for this encounter.     ALLERGIES: Penicillins and Statins   LABORATORY DATA:  No results found for this basename: WBC, HGB, HCT, MCV, PLT   No results found for this basename: NA, K, CL, CO2   No results found for this basename: ALT, AST, GGT, ALKPHOS, BILITOT     NARRATIVE: Brandon Brown was seen today for weekly treatment management. The chart was checked and the patient's films were reviewed. The patient is doing relatively well. He reports mild fatigue. He had nocturia x4 last night says that he drank a lot of fluid last night. This has been atypical and he has not had anything close to this level of nocturia the remainder of the week. He does have some urinary burning, no major change.  PHYSICAL EXAMINATION: weight is 201 lb 9.6 oz (91.445 kg). His blood pressure is 147/72 and his pulse is 65. His respiration is 16.        ASSESSMENT: The patient is doing satisfactorily with treatment.  PLAN: We will continue with the patient's radiation treatment as planned.

## 2014-04-20 NOTE — Progress Notes (Signed)
Reports nocturia x 4. Reports persistent burning in pelvis region but, no dysuria. Denies diarrhea. Denies incontinence. Reports mild fatigue. Vital stable. Weight stable.

## 2014-04-21 ENCOUNTER — Ambulatory Visit
Admission: RE | Admit: 2014-04-21 | Discharge: 2014-04-21 | Disposition: A | Discharge status: Home / Self Care | Payer: Medicare Other | Source: Ambulatory Visit | Attending: Radiation Oncology | Admitting: Radiation Oncology

## 2014-04-22 ENCOUNTER — Ambulatory Visit
Admission: RE | Admit: 2014-04-22 | Discharge: 2014-04-22 | Disposition: A | Discharge status: Home / Self Care | Payer: Medicare Other | Source: Ambulatory Visit | Attending: Radiation Oncology | Admitting: Radiation Oncology

## 2014-04-23 ENCOUNTER — Ambulatory Visit
Admission: RE | Admit: 2014-04-23 | Discharge: 2014-04-23 | Disposition: A | Discharge status: Home / Self Care | Payer: Medicare Other | Source: Ambulatory Visit | Attending: Radiation Oncology | Admitting: Radiation Oncology

## 2014-04-24 ENCOUNTER — Ambulatory Visit
Admission: RE | Admit: 2014-04-24 | Discharge: 2014-04-24 | Disposition: A | Discharge status: Home / Self Care | Payer: Medicare Other | Source: Ambulatory Visit | Attending: Radiation Oncology | Admitting: Radiation Oncology

## 2014-04-24 ENCOUNTER — Ambulatory Visit: Payer: Medicare Other

## 2014-04-24 ENCOUNTER — Ambulatory Visit: Payer: Medicare Other | Admitting: Radiation Oncology

## 2014-04-24 VITALS — BP 150/83 | HR 65 | Temp 98.1°F | Resp 20 | Wt 198.8 lb

## 2014-04-24 DIAGNOSIS — C61 Malignant neoplasm of prostate: Secondary | ICD-10-CM

## 2014-04-24 NOTE — Progress Notes (Signed)
Pt reports rectal pain/burning especially w/BM's. He denies urinary issues, diarrhea, loss of appetite. He is slightly fatigued but continues w/ADL's. Pt will complete next Tues, gave him 1 month FU card.

## 2014-04-24 NOTE — Progress Notes (Signed)
   Department of Radiation Oncology  Phone:  [phone] Fax:        [phone]  Weekly Treatment Note    Name: Brandon Brown Date: 04/24/2014 MRN: [id number] DOB: 04/04/1936   Current dose: 74.1 Gy  Current fraction: 38   MEDICATIONS: Current Outpatient Prescriptions  Medication Sig Dispense Refill  . aspirin 81 MG tablet Take 81 mg by mouth daily.      Marland Kitchen lisinopril (PRINIVIL,ZESTRIL) 30 MG tablet Take 40 mg by mouth daily.       . Omega-3 Fatty Acids (FISH OIL) 1000 MG CAPS Take by mouth.      . sildenafil (VIAGRA) 100 MG tablet Take 100 mg by mouth daily as needed for erectile dysfunction.       No current facility-administered medications for this encounter.     ALLERGIES: Penicillins and Statins   LABORATORY DATA:  No results found for this basename: WBC, HGB, HCT, MCV, PLT   No results found for this basename: NA, K, CL, CO2   No results found for this basename: ALT, AST, GGT, ALKPHOS, BILITOT     NARRATIVE: Brandon Brown was seen today for weekly treatment management. The chart was checked and the patient's films were reviewed. The patient is doing well this week. He denies any major changes. Some burning with bowel movements in the rectal area. No blood. No diarrhea. Some slight fatigue.  PHYSICAL EXAMINATION: weight is 198 lb 12.8 oz (90.175 kg). His temperature is 98.1 F (36.7 C). His blood pressure is 150/83 and his pulse is 65. His respiration is 20.        ASSESSMENT: The patient is doing satisfactorily with treatment.  PLAN: We will continue with the patient's radiation treatment as planned. The patient will followup in one month with Dr. Tammi Klippel.

## 2014-04-27 ENCOUNTER — Ambulatory Visit: Payer: Medicare Other

## 2014-04-27 ENCOUNTER — Ambulatory Visit
Admission: RE | Admit: 2014-04-27 | Discharge: 2014-04-27 | Disposition: A | Discharge status: Home / Self Care | Payer: Medicare Other | Source: Ambulatory Visit | Attending: Radiation Oncology | Admitting: Radiation Oncology

## 2014-04-28 ENCOUNTER — Encounter: Payer: Self-pay | Admitting: Radiation Oncology

## 2014-04-28 ENCOUNTER — Ambulatory Visit
Admission: RE | Admit: 2014-04-28 | Discharge: 2014-04-28 | Disposition: A | Discharge status: Home / Self Care | Payer: Medicare Other | Source: Ambulatory Visit | Attending: Radiation Oncology | Admitting: Radiation Oncology

## 2014-04-29 NOTE — Progress Notes (Signed)
  Radiation Oncology         [phone]) [phone] ________________________________  Name: Brandon Brown MRN: [id number]  Date: 04/28/2014  DOB: Sep 08, 1936  End of Treatment Note  Diagnosis:   78 y.o. gentleman with stage T2b adenocarcinoma of the prostate with a Gleason's score of 4+3 and a PSA of 4.4  Indication for treatment:  Curative IMRT with androgen deprivation       Radiation treatment dates:   03/02/2014-04/28/2014  Site/dose:   The patient was treated to a dose of 78 Gy in 40 fractions.  Beams/energy:   The patient received 6 megavolt x-rays using volumetric ARC therapy with to rapid ARC beams. He was immobilized with a body fix pillow and underwent daily cone beam CT to localize 3 gold fiducial markers the prostate and ensure proper bladder/rectal filling.  Narrative: The patient tolerated radiation treatment relatively well.   The patient experienced nocturia 4 times nightly with some discomfort in the pelvis. He had a few episodes of loose stools during his course. 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  Plan: The patient has completed radiation treatment. The patient will return to radiation oncology clinic for routine followup in one month. I advised them to call or return sooner if they have any questions or concerns related to their recovery or treatment. ________________________________  Sheral Apley. Tammi Klippel, M.D.

## 2014-05-07 ENCOUNTER — Telehealth: Payer: Self-pay | Admitting: Radiation Oncology

## 2014-05-07 ENCOUNTER — Other Ambulatory Visit: Payer: Self-pay | Admitting: Radiation Oncology

## 2014-05-07 DIAGNOSIS — C61 Malignant neoplasm of prostate: Secondary | ICD-10-CM

## 2014-05-07 DIAGNOSIS — T66XXXA Radiation sickness, unspecified, initial encounter: Secondary | ICD-10-CM

## 2014-05-07 MED ORDER — HYDROCORTISONE ACETATE 25 MG RE SUPP: 25.0000 mg | Freq: Two times a day (BID) | RECTAL | Status: DC | PRN

## 2014-05-07 NOTE — Telephone Encounter (Signed)
Returned message left by patient. He reports blood in the stool x 2 days. Reports one week prior to completion of radiation treatment on 04/28/2014 he began to feel discomfort with bowel movements. Explained yesterday morning this discomfort was gone but, there was bright red blood in the commode, in the stool and on the tissue when he wiped. Reports less blood was evident this morning but, patient still concerned. [place] but, curious if he needs medication to stop this bleeding. Informed patient this Probation officer would notify Dr. Tammi Klippel of these findings. Encouraged patient to take pillow along and alternate this pillow from hip to hip every hour to relieve rectal pressure while traveling. Patient verbalized understanding. Patient expressed concern for soreness in the joints of his knees and hands. Advised patient to follow up with PCP reference this matter since it was not related to radiation treatment. Patient verbalized understanding and requested to be contacted back on his wife's cell at [phone].

## 2014-05-07 NOTE — Telephone Encounter (Signed)
Printed Rx.

## 2014-05-08 ENCOUNTER — Telehealth: Payer: Self-pay | Admitting: Radiation Oncology

## 2014-05-08 NOTE — Telephone Encounter (Signed)
As ordered by Dr. Tammi Klippel called in [facility] script to [facility] at [place], [place]. Phoned patient at home. Spoke with his wife, reviewed script directions, and explained medication would be ready for pick up within the hour. She verbalized understanding and expressed appreciation for the call.

## 2014-05-28 ENCOUNTER — Ambulatory Visit
Admission: RE | Admit: 2014-05-28 | Discharge: 2014-05-28 | Disposition: A | Discharge status: Home / Self Care | Payer: Medicare Other | Source: Ambulatory Visit | Attending: Radiation Oncology | Admitting: Radiation Oncology

## 2014-05-28 ENCOUNTER — Encounter: Payer: Self-pay | Admitting: Radiation Oncology

## 2014-05-28 VITALS — BP 161/70 | HR 79 | Temp 98.0°F | Resp 16 | Wt 189.0 lb

## 2014-05-28 DIAGNOSIS — C61 Malignant neoplasm of prostate: Secondary | ICD-10-CM

## 2014-05-28 NOTE — Progress Notes (Signed)
Denies pain. Reports on average he gets up twice per night to void but, one night last week he got up four times. Reports nocturia depends upon fluid intake prior to bed. Reports scant blood in stool had resolved over three weeks ago but, returned this morning Denies burning with urination. Denies hematuria. Denies leakage or incontinence. Weight stable. Denies headache, dizziness, nausea or vomiting. Reports he experienced severe joint pain which resolved after five days of curamin. Reports his energy level has slowly improved. Patient denies that he has an appt set with his urologist yet.

## 2014-05-28 NOTE — Progress Notes (Signed)
  Radiation Oncology         [phone] ________________________________  Name: Brandon Brown MRN: [id number]  Date: 05/28/2014  DOB: 1936/05/31  Follow-Up Visit Note  CC: No primary provider on file.  Claybon Jabs, MD  Diagnosis:   78 y.o. gentleman with stage T2b adenocarcinoma of the prostate with a Gleason's score of 4+3 and a PSA of 4.4 s/p IMRT with androgen deprivation 03/02/2014-04/28/2014 to 78 Gy   Interval Since Last Radiation:  4  weeks  Narrative:  The patient returns today for routine follow-up.  Denies pain. Reports on average he gets up twice per night to void but, one night last week he got up four times. Reports nocturia depends upon fluid intake prior to bed. Reports scant blood in stool had resolved over three weeks ago but, returned this morning Denies burning with urination. Denies hematuria. Denies leakage or incontinence. Weight stable. Denies headache, dizziness, nausea or vomiting. Reports he experienced severe joint pain which resolved after five days of curamin. Reports his energy level has slowly improved. Patient denies that he has an appt set with his urologist yet                               ALLERGIES:  is allergic to penicillins and statins.  Meds: Current Outpatient Prescriptions  Medication Sig Dispense Refill  . hydrocortisone (ANUSOL-HC) 25 MG suppository Place 1 suppository (25 mg total) rectally 2 (two) times daily as needed (pain, bleeding).  12 suppository  5  . lisinopril (PRINIVIL,ZESTRIL) 30 MG tablet Take 40 mg by mouth daily.       . sildenafil (VIAGRA) 100 MG tablet Take 100 mg by mouth daily as needed for erectile dysfunction.       No current facility-administered medications for this encounter.    Physical Findings: The patient is in no acute distress. Patient is alert and oriented.  weight is 189 lb (85.73 kg). His oral temperature is 98 F (36.7 C). His blood pressure is 161/70 and his pulse is 79. His respiration is 16 and oxygen  saturation is 100%. .  No significant changes.  Impression:  The patient is recovering from the effects of radiation.  His arthralgia flair after radiation may not be related to his cancer or radiation treatment.  His scant rectal bleeding likely reflects grade 1-2 radiation proctitis in the acute setting and should continue to improve.  Plan:  He will continue to follow-up with urology for ongoing PSA determinations.  I will look forward to following his response through their correspondence, and be happy to participate in care if clinically indicated.  I talked to the patient about what to expect in the future, including his risk for erectile dysfunction and rectal bleeding.  I encouraged him to call or return to the office if he has any question about his previous radiation or possible radiation effects.  He was comfortable with this plan.   _____________________________________  Sheral Apley. Tammi Klippel, M.D.

## 2015-11-15 HISTORY — PX: OTHER SURGICAL HISTORY: SHX169

## 2018-04-23 ENCOUNTER — Encounter: Payer: Self-pay | Admitting: Internal Medicine

## 2019-03-15 ENCOUNTER — Other Ambulatory Visit: Payer: Self-pay

## 2019-03-15 ENCOUNTER — Encounter (HOSPITAL_COMMUNITY): Payer: Self-pay | Admitting: Emergency Medicine

## 2019-03-15 ENCOUNTER — Inpatient Hospital Stay (HOSPITAL_COMMUNITY)
Admission: EM | Admit: 2019-03-15 | Discharge: 2019-03-17 | DRG: 042 | Disposition: A | Discharge status: Home / Self Care | Payer: Medicare Other | Attending: Internal Medicine | Admitting: Internal Medicine

## 2019-03-15 DIAGNOSIS — C61 Malignant neoplasm of prostate: Secondary | ICD-10-CM | POA: Diagnosis present

## 2019-03-15 DIAGNOSIS — R29700 NIHSS score 0: Secondary | ICD-10-CM | POA: Diagnosis present

## 2019-03-15 DIAGNOSIS — R4781 Slurred speech: Secondary | ICD-10-CM | POA: Diagnosis present

## 2019-03-15 DIAGNOSIS — E785 Hyperlipidemia, unspecified: Secondary | ICD-10-CM | POA: Diagnosis present

## 2019-03-15 DIAGNOSIS — Z8249 Family history of ischemic heart disease and other diseases of the circulatory system: Secondary | ICD-10-CM

## 2019-03-15 DIAGNOSIS — I639 Cerebral infarction, unspecified: Secondary | ICD-10-CM | POA: Diagnosis present

## 2019-03-15 DIAGNOSIS — I63511 Cerebral infarction due to unspecified occlusion or stenosis of right middle cerebral artery: Secondary | ICD-10-CM | POA: Diagnosis not present

## 2019-03-15 DIAGNOSIS — R0609 Other forms of dyspnea: Secondary | ICD-10-CM | POA: Diagnosis present

## 2019-03-15 DIAGNOSIS — Z882 Allergy status to sulfonamides status: Secondary | ICD-10-CM

## 2019-03-15 DIAGNOSIS — I214 Non-ST elevation (NSTEMI) myocardial infarction: Secondary | ICD-10-CM

## 2019-03-15 DIAGNOSIS — R002 Palpitations: Secondary | ICD-10-CM | POA: Diagnosis present

## 2019-03-15 DIAGNOSIS — R200 Anesthesia of skin: Secondary | ICD-10-CM | POA: Diagnosis present

## 2019-03-15 DIAGNOSIS — Z807 Family history of other malignant neoplasms of lymphoid, hematopoietic and related tissues: Secondary | ICD-10-CM

## 2019-03-15 DIAGNOSIS — I10 Essential (primary) hypertension: Secondary | ICD-10-CM | POA: Diagnosis present

## 2019-03-15 DIAGNOSIS — Z8546 Personal history of malignant neoplasm of prostate: Secondary | ICD-10-CM

## 2019-03-15 DIAGNOSIS — G459 Transient cerebral ischemic attack, unspecified: Secondary | ICD-10-CM

## 2019-03-15 DIAGNOSIS — I708 Atherosclerosis of other arteries: Secondary | ICD-10-CM | POA: Diagnosis present

## 2019-03-15 DIAGNOSIS — R0989 Other specified symptoms and signs involving the circulatory and respiratory systems: Secondary | ICD-10-CM | POA: Diagnosis present

## 2019-03-15 DIAGNOSIS — Z923 Personal history of irradiation: Secondary | ICD-10-CM

## 2019-03-15 DIAGNOSIS — R29810 Facial weakness: Secondary | ICD-10-CM | POA: Diagnosis present

## 2019-03-15 DIAGNOSIS — R079 Chest pain, unspecified: Secondary | ICD-10-CM | POA: Diagnosis present

## 2019-03-15 DIAGNOSIS — I4949 Other premature depolarization: Secondary | ICD-10-CM | POA: Diagnosis present

## 2019-03-15 DIAGNOSIS — Z880 Allergy status to penicillin: Secondary | ICD-10-CM

## 2019-03-15 DIAGNOSIS — R7989 Other specified abnormal findings of blood chemistry: Secondary | ICD-10-CM | POA: Diagnosis present

## 2019-03-15 DIAGNOSIS — Z79899 Other long term (current) drug therapy: Secondary | ICD-10-CM

## 2019-03-15 DIAGNOSIS — Z888 Allergy status to other drugs, medicaments and biological substances status: Secondary | ICD-10-CM

## 2019-03-15 LAB — CBG MONITORING, ED: Glucose-Capillary: 108 mg/dL — ABNORMAL HIGH (ref 70–99)

## 2019-03-15 NOTE — ED Triage Notes (Signed)
Pt reports having burning in arms for the last several weeks. Pt reports on Thursday his speech was slurred and blood pressure was elevated along with blood sugar. Pt currently alert and oriented x 4.

## 2019-03-16 ENCOUNTER — Observation Stay (HOSPITAL_BASED_OUTPATIENT_CLINIC_OR_DEPARTMENT_OTHER): Payer: Medicare Other

## 2019-03-16 ENCOUNTER — Inpatient Hospital Stay (HOSPITAL_COMMUNITY): Payer: Medicare Other

## 2019-03-16 ENCOUNTER — Observation Stay (HOSPITAL_COMMUNITY): Payer: Medicare Other

## 2019-03-16 ENCOUNTER — Emergency Department (HOSPITAL_COMMUNITY): Payer: Medicare Other

## 2019-03-16 DIAGNOSIS — C61 Malignant neoplasm of prostate: Secondary | ICD-10-CM

## 2019-03-16 DIAGNOSIS — Z807 Family history of other malignant neoplasms of lymphoid, hematopoietic and related tissues: Secondary | ICD-10-CM | POA: Diagnosis not present

## 2019-03-16 DIAGNOSIS — R0609 Other forms of dyspnea: Secondary | ICD-10-CM | POA: Diagnosis not present

## 2019-03-16 DIAGNOSIS — R0789 Other chest pain: Secondary | ICD-10-CM

## 2019-03-16 DIAGNOSIS — Z8546 Personal history of malignant neoplasm of prostate: Secondary | ICD-10-CM | POA: Diagnosis not present

## 2019-03-16 DIAGNOSIS — R079 Chest pain, unspecified: Secondary | ICD-10-CM | POA: Diagnosis present

## 2019-03-16 DIAGNOSIS — E785 Hyperlipidemia, unspecified: Secondary | ICD-10-CM | POA: Diagnosis not present

## 2019-03-16 DIAGNOSIS — I639 Cerebral infarction, unspecified: Secondary | ICD-10-CM | POA: Diagnosis present

## 2019-03-16 DIAGNOSIS — R0989 Other specified symptoms and signs involving the circulatory and respiratory systems: Secondary | ICD-10-CM | POA: Diagnosis present

## 2019-03-16 DIAGNOSIS — I708 Atherosclerosis of other arteries: Secondary | ICD-10-CM | POA: Diagnosis not present

## 2019-03-16 DIAGNOSIS — Z880 Allergy status to penicillin: Secondary | ICD-10-CM | POA: Diagnosis not present

## 2019-03-16 DIAGNOSIS — I4949 Other premature depolarization: Secondary | ICD-10-CM | POA: Diagnosis not present

## 2019-03-16 DIAGNOSIS — R200 Anesthesia of skin: Secondary | ICD-10-CM | POA: Diagnosis not present

## 2019-03-16 DIAGNOSIS — R002 Palpitations: Secondary | ICD-10-CM | POA: Diagnosis not present

## 2019-03-16 DIAGNOSIS — Z8249 Family history of ischemic heart disease and other diseases of the circulatory system: Secondary | ICD-10-CM | POA: Diagnosis not present

## 2019-03-16 DIAGNOSIS — R7989 Other specified abnormal findings of blood chemistry: Secondary | ICD-10-CM | POA: Diagnosis not present

## 2019-03-16 DIAGNOSIS — Z888 Allergy status to other drugs, medicaments and biological substances status: Secondary | ICD-10-CM | POA: Diagnosis not present

## 2019-03-16 DIAGNOSIS — I10 Essential (primary) hypertension: Secondary | ICD-10-CM | POA: Diagnosis present

## 2019-03-16 DIAGNOSIS — I6389 Other cerebral infarction: Secondary | ICD-10-CM | POA: Diagnosis not present

## 2019-03-16 DIAGNOSIS — Z79899 Other long term (current) drug therapy: Secondary | ICD-10-CM | POA: Diagnosis not present

## 2019-03-16 DIAGNOSIS — R29810 Facial weakness: Secondary | ICD-10-CM | POA: Diagnosis not present

## 2019-03-16 DIAGNOSIS — R29700 NIHSS score 0: Secondary | ICD-10-CM | POA: Diagnosis not present

## 2019-03-16 DIAGNOSIS — R4781 Slurred speech: Secondary | ICD-10-CM | POA: Diagnosis not present

## 2019-03-16 DIAGNOSIS — Z882 Allergy status to sulfonamides status: Secondary | ICD-10-CM | POA: Diagnosis not present

## 2019-03-16 DIAGNOSIS — I63511 Cerebral infarction due to unspecified occlusion or stenosis of right middle cerebral artery: Secondary | ICD-10-CM | POA: Diagnosis present

## 2019-03-16 DIAGNOSIS — Z923 Personal history of irradiation: Secondary | ICD-10-CM | POA: Diagnosis not present

## 2019-03-16 LAB — RAPID URINE DRUG SCREEN, HOSP PERFORMED
Amphetamines: NOT DETECTED
BARBITURATES: NOT DETECTED
Benzodiazepines: NOT DETECTED
COCAINE: NOT DETECTED
Opiates: NOT DETECTED
Tetrahydrocannabinol: NOT DETECTED

## 2019-03-16 LAB — HEMOGLOBIN A1C
Hgb A1c MFr Bld: 6 % — ABNORMAL HIGH (ref 4.8–5.6)
Mean Plasma Glucose: 125.5 mg/dL

## 2019-03-16 LAB — TROPONIN I
Troponin I: 0.05 ng/mL (ref <0.03)
Troponin I: 0.03 ng/mL (ref <0.03)

## 2019-03-16 LAB — COMPREHENSIVE METABOLIC PANEL
ALK PHOS: 70 U/L (ref 38–126)
ALT: 25 U/L (ref 0–44)
AST: 28 U/L (ref 15–41)
Albumin: 4.4 g/dL (ref 3.5–5.0)
Anion gap: 9 (ref 5–15)
BILIRUBIN TOTAL: 0.6 mg/dL (ref 0.3–1.2)
BUN: 20 mg/dL (ref 8–23)
CALCIUM: 9.2 mg/dL (ref 8.9–10.3)
CO2: 26 mmol/L (ref 22–32)
CREATININE: 0.86 mg/dL (ref 0.61–1.24)
Chloride: 101 mmol/L (ref 98–111)
GFR calc Af Amer: >60 mL/min (ref >60)
GFR calc non Af Amer: >60 mL/min (ref >60)
Glucose, Bld: 108 mg/dL — ABNORMAL HIGH (ref 70–99)
Potassium: 4.1 mmol/L (ref 3.5–5.1)
SODIUM: 136 mmol/L (ref 135–145)
Total Protein: 7.5 g/dL (ref 6.5–8.1)

## 2019-03-16 LAB — D-DIMER, QUANTITATIVE (NOT AT ARMC): D DIMER QUANT: 0.71 ug{FEU}/mL — AB (ref 0.00–0.50)

## 2019-03-16 LAB — DIFFERENTIAL
Abs Immature Granulocytes: 0.05 10*3/uL (ref 0.00–0.07)
BASOS ABS: 0 10*3/uL (ref 0.0–0.1)
Basophils Relative: 1 %
Eosinophils Absolute: 0.1 10*3/uL (ref 0.0–0.5)
Eosinophils Relative: 2 %
Immature Granulocytes: 1 %
LYMPHS ABS: 2.2 10*3/uL (ref 0.7–4.0)
LYMPHS PCT: 36 %
MONOS PCT: 9 %
Monocytes Absolute: 0.6 10*3/uL (ref 0.1–1.0)
NEUTROS ABS: 3 10*3/uL (ref 1.7–7.7)
Neutrophils Relative %: 51 %

## 2019-03-16 LAB — ECHOCARDIOGRAM COMPLETE
Height: 73 in
Weight: 3168 oz

## 2019-03-16 LAB — CBC
HEMATOCRIT: 42.7 % (ref 39.0–52.0)
HEMOGLOBIN: 14.6 g/dL (ref 13.0–17.0)
MCH: 33.7 pg (ref 26.0–34.0)
MCHC: 34.2 g/dL (ref 30.0–36.0)
MCV: 98.6 fL (ref 80.0–100.0)
Platelets: 197 10*3/uL (ref 150–400)
RBC: 4.33 MIL/uL (ref 4.22–5.81)
RDW: 12.1 % (ref 11.5–15.5)
WBC: 5.9 10*3/uL (ref 4.0–10.5)
nRBC: 0 % (ref 0.0–0.2)

## 2019-03-16 LAB — PROTIME-INR
INR: 1 (ref 0.8–1.2)
PROTHROMBIN TIME: 13.1 s (ref 11.4–15.2)

## 2019-03-16 LAB — HEPARIN LEVEL (UNFRACTIONATED): HEPARIN UNFRACTIONATED: 0.26 [IU]/mL — AB (ref 0.30–0.70)

## 2019-03-16 LAB — URINALYSIS, ROUTINE W REFLEX MICROSCOPIC
Bilirubin Urine: NEGATIVE
Glucose, UA: NEGATIVE mg/dL
Hgb urine dipstick: NEGATIVE
Ketones, ur: NEGATIVE mg/dL
Leukocytes,Ua: NEGATIVE
Nitrite: NEGATIVE
Protein, ur: NEGATIVE mg/dL
Specific Gravity, Urine: 1.012 (ref 1.005–1.030)
pH: 5 (ref 5.0–8.0)

## 2019-03-16 LAB — APTT: aPTT: 28 seconds (ref 24–36)

## 2019-03-16 LAB — TSH: TSH: 1.853 u[IU]/mL (ref 0.350–4.500)

## 2019-03-16 MED ORDER — ASPIRIN 81 MG PO CHEW
81.0000 mg | CHEWABLE_TABLET | Freq: Every day | ORAL | Status: DC
  Administered 2019-03-16 – 2019-03-17 (×2): 81 mg via ORAL
  Filled 2019-03-16 (×2): qty 1

## 2019-03-16 MED ORDER — LISINOPRIL 20 MG PO TABS
40.0000 mg | ORAL_TABLET | Freq: Every day | ORAL | Status: DC
  Administered 2019-03-16 – 2019-03-17 (×2): 40 mg via ORAL
  Filled 2019-03-16 (×2): qty 2

## 2019-03-16 MED ORDER — CLOPIDOGREL BISULFATE 75 MG PO TABS
75.0000 mg | ORAL_TABLET | Freq: Every day | ORAL | Status: DC
  Administered 2019-03-17: 75 mg via ORAL
  Filled 2019-03-16: qty 1

## 2019-03-16 MED ORDER — ACETAMINOPHEN 325 MG PO TABS: 650.0000 mg | ORAL_TABLET | ORAL | Status: DC | PRN

## 2019-03-16 MED ORDER — HEPARIN BOLUS VIA INFUSION
4000.0000 [IU] | Freq: Once | INTRAVENOUS | Status: AC
  Administered 2019-03-16: 4000 [IU] via INTRAVENOUS
  Filled 2019-03-16: qty 4000

## 2019-03-16 MED ORDER — ONDANSETRON HCL 4 MG/2ML IJ SOLN: 4.0000 mg | Freq: Four times a day (QID) | INTRAMUSCULAR | Status: DC | PRN

## 2019-03-16 MED ORDER — IOHEXOL 350 MG/ML SOLN
75.0000 mL | Freq: Once | INTRAVENOUS | Status: AC | PRN
  Administered 2019-03-16: 75 mL via INTRAVENOUS

## 2019-03-16 MED ORDER — STROKE: EARLY STAGES OF RECOVERY BOOK
Freq: Once | Status: AC
  Administered 2019-03-16: 15:00:00

## 2019-03-16 MED ORDER — HEPARIN (PORCINE) 25000 UT/250ML-% IV SOLN
1250.0000 [IU]/h | INTRAVENOUS | Status: DC
  Administered 2019-03-16: 1100 [IU]/h via INTRAVENOUS
  Filled 2019-03-16: qty 250

## 2019-03-16 MED ORDER — ENOXAPARIN SODIUM 40 MG/0.4ML ~~LOC~~ SOLN
40.0000 mg | SUBCUTANEOUS | Status: DC
  Administered 2019-03-16: 40 mg via SUBCUTANEOUS
  Filled 2019-03-16: qty 0.4

## 2019-03-16 NOTE — Progress Notes (Signed)
ANTICOAGULATION CONSULT NOTE -  Consult  Pharmacy Consult for IV Heparin Indication: chest pain/ACS  Allergies  Allergen Reactions  . Penicillins Other (See Comments)    Unknown Did it involve swelling of the face/tongue/throat, SOB, or low BP? Unknown Did it involve sudden or severe rash/hives, skin peeling, or any reaction on the inside of your mouth or nose? Unknown Did you need to seek medical attention at a hospital or doctor's office? Unknown When did it last happen? Unknown  If all above answers are "NO", may proceed with cephalosporin use.   . Statins Other (See Comments)    Joints, muscles ache    Patient Measurements: Height: 6\' 1"  (185.4 cm) Weight: 198 lb (89.8 kg) IBW/kg (Calculated) : 79.9 Heparin Dosing Weight:   Vital Signs: Temp: 98.5 F (36.9 C) (03/21 2301) Temp Source: Oral (03/21 2301) BP: 142/93 (03/22 0200) Pulse Rate: 69 (03/22 0200)  Labs: Recent Labs    03/15/19 2317  HGB 14.6  HCT 42.7  PLT 197  APTT 28  LABPROT 13.1  INR 1.0  CREATININE 0.86  TROPONINI 0.05*    Estimated Creatinine Clearance: 77.4 mL/min (by C-G formula based on SCr of 0.86 mg/dL).   Medications:  Scheduled:    Assessment: Pharmacy is consulted to start heparin drip on 83 yo male diagnosed with ACS. No anticoagulants or antiplatelet meds noted on Med Rec.   Baseline Labs on 3/22  Pt 13.1, INR 1.0  Scr 0.86 mg/dl  APTT 28  Hgb 14.5, plt 197  Goal of Therapy:  Heparin level 0.3-0.7 units/ml Monitor platelets by anticoagulation protocol: Yes   Plan:    Heparin 4000 unit bolus followed by 1100 unit/hr  HL 8 hours after start of infusion  Daily CBC  Monitor for signs and symptoms of bleeding  Royetta Asal, PharmD, BCPS Pager [phone] 03/16/2019 2:39 AM

## 2019-03-16 NOTE — ED Notes (Addendum)
Report given to [facility] RN. [facility] called for transport. Dr. Jonelle Sidle called, CT angio not to be done at this time.

## 2019-03-16 NOTE — Progress Notes (Signed)
[facility] PROGRESS NOTE  DORSE LOCY MIW:[id number] DOB: Apr 14, 1936 DOA: 03/15/2019  PCP: Townsend Roger, MD  Brief History/Interval Summary: 83 y.o. male with medical history significant of prostate cancer, hypertension who was doing well until Monday when he mowed his grass.  He felt weak and generally unwell afterwards.  The next day he felt palpitation and also very weak.  He also mentioned numbness in both of his arms at times.  Denies it currently.  There was also mention of slurred speech.  Patient was evaluated by the fire department and was found to have elevated blood pressure and glucose levels.  He was subsequently brought into the hospital by his grandson.  CT showed an age-indeterminate infarct.  Patient was hospitalized for further management.  Reason for Visit: Generalized weakness.  Abnormal CT head.  Mildly elevated troponin.  Consultants: None yet  Procedures: Transthoracic echocardiogram is pending  Antibiotics: None  Subjective/Interval History: Patient denies any chest pain shortness of breath currently.  He was surprised to hear of heart murmur when examined last night.  He is compliant with his blood pressure medications.  Denies any weakness on any one side of his body.  No visual disturbances.  Speech is normal currently according to his grandson.  ROS: Denies any nausea or vomiting  Objective:  Vital Signs  Vitals:   03/16/19 0230 03/16/19 0343 03/16/19 0530 03/16/19 0730  BP: (!) 154/93 (!) 165/98 (!) 163/93 (!) 158/97  Pulse: 73 87 67   Resp: 13 18    Temp:  98.1 F (36.7 C) 98 F (36.7 C) 98 F (36.7 C)  TempSrc:  Oral Oral Oral  SpO2: 96% 96% 95% 97%  Weight:      Height:        Intake/Output Summary (Last 24 hours) at 03/16/2019 2500 Last data filed at 03/16/2019 3704 Gross per 24 hour  Intake 82.52 ml  Output 350 ml  Net -267.48 ml   Filed Weights   03/15/19 2301  Weight: 89.8 kg    General appearance: alert,  cooperative, appears stated age and no distress Head: Normocephalic, without obvious abnormality, atraumatic Resp: clear to auscultation bilaterally Cardio: S1-S2 is normal regular.  No S3-S4.  Systolic murmur appreciated over the aortic area with radiation to the carotids. GI: soft, non-tender; bowel sounds normal; no masses,  no organomegaly Extremities: extremities normal, atraumatic, no cyanosis or edema Pulses: 2+ and symmetric Neurologic: Alert and oriented x3.  Cranial nerves II to XII intact.  Motor strength equal bilateral upper and lower extremities.  No pronator drift.  Lab Results:  Data Reviewed: I have personally reviewed following labs and imaging studies  CBC: Recent Labs  Lab 03/15/19 2317  WBC 5.9  NEUTROABS 3.0  HGB 14.6  HCT 42.7  MCV 98.6  PLT 888    Basic Metabolic Panel: Recent Labs  Lab 03/15/19 2317  NA 136  K 4.1  CL 101  CO2 26  GLUCOSE 108*  BUN 20  CREATININE 0.86  CALCIUM 9.2    GFR: Estimated Creatinine Clearance: 77.4 mL/min (by C-G formula based on SCr of 0.86 mg/dL).  Liver Function Tests: Recent Labs  Lab 03/15/19 2317  AST 28  ALT 25  ALKPHOS 70  BILITOT 0.6  PROT 7.5  ALBUMIN 4.4     Coagulation Profile: Recent Labs  Lab 03/15/19 2317  INR 1.0    Cardiac Enzymes: Recent Labs  Lab 03/15/19 2317  TROPONINI 0.05*    CBG: Recent Labs  Lab 03/15/19 2313  GLUCAP 108*     Radiology Studies: Ct Head Wo Contrast  Result Date: 03/16/2019 CLINICAL DATA:  Hypertension. Confusion. EXAM: CT HEAD WITHOUT CONTRAST TECHNIQUE: Contiguous axial images were obtained from the base of the skull through the vertex without intravenous contrast. COMPARISON:  None. FINDINGS: Brain: No evidence of intracranial hemorrhage, hydrocephalus, extra-axial collection or mass lesion/mass effect. There is a small area of hypoattenuation in the cortical and subcortical matter of the right parietal lobe. Moderate brain parenchymal volume  loss and deep white matter microangiopathy. Vascular: Calcific atherosclerotic disease. Skull: Normal. Negative for fracture or focal lesion. Sinuses/Orbits: No acute finding. Other: None. IMPRESSION: 1. Small area of hypoattenuation in the cortical and subcortical matter of the right parietal lobe. This likely represents an age-indeterminate ischemic infarct. 2. No evidence of intracranial hemorrhage. Electronically Signed   By: Fidela Salisbury M.D.   On: 03/16/2019 00:52     Medications:  Scheduled: . aspirin  81 mg Oral Daily  . lisinopril  40 mg Oral Daily   Continuous: . heparin 1,100 Units/hr (03/16/19 0226)   [id number], ondansetron (ZOFRAN) IV    Assessment/Plan:    Palpitations/dyspnea Unclear if he ever had any chest pain.  Patient was vague historian.  He did have some numbness in both his arms.  Apparently he mentioned yesterday that he had numbness in his left arm.  Today he tells me he had it in both the arms.  Patient was found to have abnormal EKG with T wave inversions in the inferior leads.  Minimally elevated troponin.  He was started on IV heparin by the admitting doctor.  Echocardiogram is pending.  He is on aspirin.  Check lipid panel.  Trend troponins.  Consider cardiology consultation depending on the results of the above.  Check TSH.  Abnormal CT head Patient did have transient slurred speech and numbness in his arms.  He denies any neck pain but has had injuries when he used to play sports in his younger days.  He did have some neck issues then.  Bilateral arm numbness could also be cervical in origin.  Apart from MRI brain we will also get MRI cervical spine.  CT head did show age-indeterminate infarct in the right parietal lobe.  Follow-up on MRI.  He did pass for swallow screen last night.  Lipid panel.  PT and OT evaluation.Marland Kitchen  History of essential hypertension Patient takes lisinopril and amlodipine.  Monitor blood pressures closely.  History of  prostate cancer Followed by urology.  Not noted to be on any medications for the same.  DVT Prophylaxis: On IV heparin currently    Code Status: Full code Family Communication: Discussed with the patient and his grandson Disposition Plan: Management as outlined above.    LOS: 0 days   Dorothymae Maciver [employer] on [email or web address]  03/16/2019, 8:39 AM

## 2019-03-16 NOTE — ED Notes (Signed)
ED TO INPATIENT HANDOFF REPORT  Name/Age/Gender Brandon Brown 83 y.o. male Room/Bed: WA14/WA14  Code Status   Code Status: Not on file  Home/SNF/Other Home Patient oriented to: self, place, time and situation Is this baseline? Yes   Triage Complete: Triage complete  Chief Complaint Burning Sensation in Arm / Numbness of Mouth   Triage Note Pt reports having burning in arms for the last several weeks. Pt reports on Thursday his speech was slurred and blood pressure was elevated along with blood sugar. Pt currently alert and oriented x 4.    Allergies Allergies  Allergen Reactions  . Penicillins Other (See Comments)    Unknown Did it involve swelling of the face/tongue/throat, SOB, or low BP? Unknown Did it involve sudden or severe rash/hives, skin peeling, or any reaction on the inside of your mouth or nose? Unknown Did you need to seek medical attention at a hospital or doctor's office? Unknown When did it last happen? Unknown  If all above answers are "NO", may proceed with cephalosporin use.   . Statins Other (See Comments)    Joints, muscles ache    Level of Care/Admitting Diagnosis ED Disposition    ED Disposition Condition [facility] Area: [place] [100100]  Level of Care: Telemetry Medical [104]  I expect the patient will be discharged within 24 hours: Yes  LOW acuity---Tx typically complete <24 hrs---ACUTE conditions typically can be evaluated <24 hours---LABS likely to return to acceptable levels <24 hours---IS near functional baseline---EXPECTED to return to current living arrangement---NOT newly hypoxic: Meets criteria for 5C-Observation unit  Diagnosis: Acute CVA (cerebrovascular accident) [facility]) [[id number]]  Admitting Physician: Elwyn Reach [[id number]]  Attending Physician: Elwyn Reach [[id number]]  PT Class (Do Not Modify): Observation [104]  PT Acc Code (Do Not Modify): Observation [10022]       B Medical/Surgery  History Past Medical History:  Diagnosis Date  . Hx of prostatitis   . Hyperlipidemia   . Hypertension   . Prostate cancer [facility])    Past Surgical History:  Procedure Laterality Date  . FEMUR FRACTURE SURGERY    . HERNIA REPAIR    . INCISION / DRAINAGE HAND / FINGER    . KNEE ARTHROSCOPY    . PROSTATE BIOPSY    . ROTATOR CUFF REPAIR    . TRANSURETHRAL RESECTION OF PROSTATE    . TRANSURETHRAL RESECTION OF PROSTATE    . tunica vaginalis excision of hydrocele right       A IV Location/Drains/Wounds Patient Lines/Drains/Airways Status   Active Line/Drains/Airways    Name:   Placement date:   Placement time:   Site:   Days:   Peripheral IV 03/15/19 Right Forearm   03/15/19    2320    Forearm   1          Intake/Output Last 24 hours No intake or output data in the 24 hours ending 03/16/19 0206  Labs/Imaging Results for orders placed or performed during the hospital encounter of 03/15/19 (from the past 48 hour(s))  CBG monitoring, ED     Status: Abnormal   Collection Time: 03/15/19 11:13 PM  Result Value Ref Range   Glucose-Capillary 108 (H) 70 - 99 mg/dL  Protime-INR     Status: None   Collection Time: 03/15/19 11:17 PM  Result Value Ref Range   Prothrombin Time 13.1 11.4 - 15.2 seconds   INR 1.0 0.8 - 1.2    Comment: (NOTE) INR goal varies  based on device and disease states. Performed at [facility], [place] [address], [place], [place] [address]   APTT     Status: None   Collection Time: 03/15/19 11:17 PM  Result Value Ref Range   aPTT 28 24 - 36 seconds    Comment: Performed at [facility], [place] [address] [place], [place] [address]  CBC     Status: None   Collection Time: 03/15/19 11:17 PM  Result Value Ref Range   WBC 5.9 4.0 - 10.5 K/uL   RBC 4.33 4.22 - 5.81 MIL/uL   Hemoglobin 14.6 13.0 - 17.0 g/dL   HCT 42.7 39.0 - 52.0 %   MCV 98.6 80.0 - 100.0 fL   MCH 33.7 26.0 - 34.0 pg   MCHC 34.2 30.0 - 36.0 g/dL   RDW 12.1  11.5 - 15.5 %   Platelets 197 150 - 400 K/uL   nRBC 0.0 0.0 - 0.2 %    Comment: Performed at [facility], [place] [address], [place], [place] [address]  Differential     Status: None   Collection Time: 03/15/19 11:17 PM  Result Value Ref Range   Neutrophils Relative % 51 %   Neutro Abs 3.0 1.7 - 7.7 K/uL   Lymphocytes Relative 36 %   Lymphs Abs 2.2 0.7 - 4.0 K/uL   Monocytes Relative 9 %   Monocytes Absolute 0.6 0.1 - 1.0 K/uL   Eosinophils Relative 2 %   Eosinophils Absolute 0.1 0.0 - 0.5 K/uL   Basophils Relative 1 %   Basophils Absolute 0.0 0.0 - 0.1 K/uL   Immature Granulocytes 1 %   Abs Immature Granulocytes 0.05 0.00 - 0.07 K/uL    Comment: Performed at [facility], [place] [address], [place], [place] [address]  Comprehensive metabolic panel     Status: Abnormal   Collection Time: 03/15/19 11:17 PM  Result Value Ref Range   Sodium 136 135 - 145 mmol/L   Potassium 4.1 3.5 - 5.1 mmol/L   Chloride 101 98 - 111 mmol/L   CO2 26 22 - 32 mmol/L   Glucose, Bld 108 (H) 70 - 99 mg/dL   BUN 20 8 - 23 mg/dL   Creatinine, Ser 0.86 0.61 - 1.24 mg/dL   Calcium 9.2 8.9 - 10.3 mg/dL   Total Protein 7.5 6.5 - 8.1 g/dL   Albumin 4.4 3.5 - 5.0 g/dL   AST 28 15 - 41 U/L   ALT 25 0 - 44 U/L   Alkaline Phosphatase 70 38 - 126 U/L   Total Bilirubin 0.6 0.3 - 1.2 mg/dL   GFR calc non Af Amer >60 >60 mL/min   GFR calc Af Amer >60 >60 mL/min   Anion gap 9 5 - 15    Comment: Performed at [facility], [place] [address] [place], [place] [address]  Troponin I - ONCE - STAT     Status: Abnormal   Collection Time: 03/15/19 11:17 PM  Result Value Ref Range   Troponin I 0.05 (HH) <0.03 ng/mL    Comment: CRITICAL RESULT CALLED TO, READ BACK BY AND VERIFIED WITH: Donnetta Hail RN [id number] 03/16/2019 [place] K Performed at [place] [address] [place], [place] [address]   D-dimer, quantitative (not at [facility])     Status: Abnormal    Collection Time: 03/15/19 11:17 PM  Result Value Ref Range   D-Dimer, Quant 0.71 (H) 0.00 - 0.50 ug/mL-FEU  Comment: (NOTE) At the manufacturer cut-off of 0.50 ug/mL FEU, this assay has been documented to exclude PE with a sensitivity and negative predictive value of 97 to 99%.  At this time, this assay has not been approved by the FDA to exclude DVT/VTE. Results should be correlated with clinical presentation. Performed at [facility], [place] [address] [place], [place] [address]   Urine rapid drug screen (hosp performed)     Status: None   Collection Time: 03/15/19 11:34 PM  Result Value Ref Range   Opiates NONE DETECTED NONE DETECTED   Cocaine NONE DETECTED NONE DETECTED   Benzodiazepines NONE DETECTED NONE DETECTED   Amphetamines NONE DETECTED NONE DETECTED   Tetrahydrocannabinol NONE DETECTED NONE DETECTED   Barbiturates NONE DETECTED NONE DETECTED    Comment: (NOTE) DRUG SCREEN FOR MEDICAL PURPOSES ONLY.  IF CONFIRMATION IS NEEDED FOR ANY PURPOSE, NOTIFY LAB WITHIN 5 DAYS. LOWEST DETECTABLE LIMITS FOR URINE DRUG SCREEN Drug Class                     Cutoff (ng/mL) Amphetamine and metabolites    1000 Barbiturate and metabolites    200 Benzodiazepine                 093 Tricyclics and metabolites     300 Opiates and metabolites        300 Cocaine and metabolites        300 THC                            50 Performed at [facility], [place] [address], [place], [place] [address]   Urinalysis, Routine w reflex microscopic     Status: None   Collection Time: 03/15/19 11:34 PM  Result Value Ref Range   Color, Urine YELLOW YELLOW   APPearance CLEAR CLEAR   Specific Gravity, Urine 1.012 1.005 - 1.030   pH 5.0 5.0 - 8.0   Glucose, UA NEGATIVE NEGATIVE mg/dL   Hgb urine dipstick NEGATIVE NEGATIVE   Bilirubin Urine NEGATIVE NEGATIVE   Ketones, ur NEGATIVE NEGATIVE mg/dL   Protein, ur NEGATIVE NEGATIVE mg/dL   Nitrite NEGATIVE NEGATIVE    Leukocytes,Ua NEGATIVE NEGATIVE    Comment: Performed at [facility] [address], [place], [place] [address]   Ct Head Wo Contrast  Result Date: 03/16/2019 CLINICAL DATA:  Hypertension. Confusion. EXAM: CT HEAD WITHOUT CONTRAST TECHNIQUE: Contiguous axial images were obtained from the base of the skull through the vertex without intravenous contrast. COMPARISON:  None. FINDINGS: Brain: No evidence of intracranial hemorrhage, hydrocephalus, extra-axial collection or mass lesion/mass effect. There is a small area of hypoattenuation in the cortical and subcortical matter of the right parietal lobe. Moderate brain parenchymal volume loss and deep white matter microangiopathy. Vascular: Calcific atherosclerotic disease. Skull: Normal. Negative for fracture or focal lesion. Sinuses/Orbits: No acute finding. Other: None. IMPRESSION: 1. Small area of hypoattenuation in the cortical and subcortical matter of the right parietal lobe. This likely represents an age-indeterminate ischemic infarct. 2. No evidence of intracranial hemorrhage. Electronically Signed   By: Fidela Salisbury M.D.   On: 03/16/2019 00:52    Pending Labs Unresulted Labs (From admission, onward)   None      Vitals/Pain Today's Vitals   03/16/19 0030 03/16/19 0100 03/16/19 0130 03/16/19 0200  BP: (!) 134/94 135/89 (!) 129/97 (!) 142/93  Pulse: 72 70 79 69  Resp: 17  11 17 15   Temp:      TempSrc:      SpO2: 94% 93% 93% 94%  Weight:      Height:      PainSc:        Isolation Precautions No active isolations  Medications Medications  heparin bolus via infusion 4,000 Units (has no administration in time range)  heparin ADULT infusion 100 units/mL (25000 units/264mL sodium chloride 0.45%) (has no administration in time range)    Mobility walks Low fall risk

## 2019-03-16 NOTE — ED Notes (Signed)
Date and time results received: 03/16/19 12:28 AM (use smartphrase ".now" to insert current time)  Test: Troponin Critical Value: 0.05  Name of Provider Notified: [place]  Orders Received? Or Actions Taken?:

## 2019-03-16 NOTE — Plan of Care (Signed)
  Problem: Ischemic Stroke/TIA Tissue Perfusion: Goal: Complications of ischemic stroke/TIA will be minimized Outcome: Progressing   Problem: Education: Goal: Knowledge of patient specific risk factors addressed and post discharge goals established will improve Outcome: Progressing

## 2019-03-16 NOTE — Progress Notes (Signed)
ANTICOAGULATION CONSULT NOTE -  Consult  Pharmacy Consult for IV Heparin Indication: chest pain/ACS  Allergies  Allergen Reactions  . Penicillins Other (See Comments)    Unknown Did it involve swelling of the face/tongue/throat, SOB, or low BP? Unknown Did it involve sudden or severe rash/hives, skin peeling, or any reaction on the inside of your mouth or nose? Unknown Did you need to seek medical attention at a hospital or doctor's office? Unknown When did it last happen? Unknown  If all above answers are "NO", may proceed with cephalosporin use.   . Statins Other (See Comments)    Joints, muscles ache    Patient Measurements: Height: 6\' 1"  (185.4 cm) Weight: 198 lb (89.8 kg) IBW/kg (Calculated) : 79.9 Heparin Dosing Weight:   Vital Signs: Temp: 98.2 F (36.8 C) (03/22 0930) Temp Source: Oral (03/22 0930) BP: 154/90 (03/22 0930) Pulse Rate: 67 (03/22 0530)  Labs: Recent Labs    03/15/19 2317 03/16/19 1120  HGB 14.6  --   HCT 42.7  --   PLT 197  --   APTT 28  --   LABPROT 13.1  --   INR 1.0  --   HEPARINUNFRC  --  0.26*  CREATININE 0.86  --   TROPONINI 0.05* 0.03*    Estimated Creatinine Clearance: 77.4 mL/min (by C-G formula based on SCr of 0.86 mg/dL).   Medications:  Scheduled:  .  stroke: mapping our early stages of recovery book   Does not apply Once  . aspirin  81 mg Oral Daily  . lisinopril  40 mg Oral Daily    Assessment: Pharmacy is consulted to start heparin drip on 83 yo male diagnosed with ACS. No anticoagulants or antiplatelet meds noted on Med Rec.   Baseline Labs on 3/22  Pt 13.1, INR 1.0  Scr 0.86 mg/dl  APTT 28  Hgb 14.5, plt 197  Heparin level came back at 0.26. We will increase the rate. We will adjust the goal to 0.3-5 due to the association with age-intermediate infart (CVA).   Goal of Therapy:  Heparin level 0.3-0.5 Monitor platelets by anticoagulation protocol: Yes   Plan:    Increase heparin to 1250 units/hr F/u  with 8 hr HL Daily HL and CBC  Onnie Boer, PharmD, BCIDP, AAHIVP, CPP Infectious Disease Pharmacist 03/16/2019 1:08 PM

## 2019-03-16 NOTE — Consult Note (Addendum)
NEURO HOSPITALIST  CONSULT   Requesting Physician: Dr. [place] Pink    Chief Complaint: weakness and chest pain  History obtained from:  Patient     HPI:                                                                                                                                         Brandon Brown is an 83 y.o. male  With PMH of HTN, HLD, prostate cancer ( s/p radiation) who presented to [facility] on 03/15/2019 with c/o weakness and chest pain. Neurology consulted for subacute infarct seen on MRI.   Patient was mowing his lawn on Monday 03/10/2019 about 1600 . After he finished he a generalized weakness and not well. The bect day he felt palpitations and very weak. He drank some beers laid down and then went to sleep. when he woke up the next morning ( Wednesday) he was still feeling sick.  His left arm felt heavy and he had some difficulties with speech that day. When he went to [place] on 3/21 his grandson persuaded him  to come to the hospital for his symptoms.   Had some tingling in the left corner of his mouth. No previous stroke history. Denies ASA, blood thinners, drug use, smoking. Endorses social drinking.  Denies SOB, Vision changes, HA. Patient has been on a statin in the past, but stopped taking it due to muscle and joint pain.  Hospital  course:  CTH: Small area of hypoattenuation in the cortical and subcortical matter of the right parietal lobe MRI: Multiple small areas of acute infarct in the right frontal parietal cortex HgbA1c: 6.0 BP: 154/90 BG: 108  Date last known well: 03/10/2019 Time last known well: 1600 tPA Given: No: outside of window  Modified Rankin: Rankin Score=0 NIHSS:0     Past Medical History:  Diagnosis Date  . Hx of prostatitis   . Hyperlipidemia   . Hypertension   . Prostate cancer [facility])     Past Surgical History:  Procedure Laterality Date  . FEMUR FRACTURE SURGERY    . HERNIA REPAIR    . INCISION /  DRAINAGE HAND / FINGER    . KNEE ARTHROSCOPY    . PROSTATE BIOPSY    . ROTATOR CUFF REPAIR    . TRANSURETHRAL RESECTION OF PROSTATE    . TRANSURETHRAL RESECTION OF PROSTATE    . tunica vaginalis excision of hydrocele right      Family History  Problem Relation Age of Onset  . Heart disease Mother   . Non-Hodgkin's lymphoma Mother          Social History:  reports that he has never smoked. He has never used smokeless tobacco. He reports that he does not drink alcohol or use drugs.  Allergies:  Allergies  Allergen Reactions  . Penicillins Other (See Comments)    Unknown Did it involve swelling of the face/tongue/throat, SOB, or low BP? Unknown Did it involve sudden or severe rash/hives, skin peeling, or any reaction on the inside of your mouth or nose? Unknown Did you need to seek medical attention at a hospital or doctor's office? Unknown When did it last happen? Unknown  If all above answers are "NO", may proceed with cephalosporin use.   . Statins Other (See Comments)    Joints, muscles ache    Medications:                                                                                                                           Scheduled: .  stroke: mapping our early stages of recovery book   Does not apply Once  . aspirin  81 mg Oral Daily  . lisinopril  40 mg Oral Daily   Continuous: . heparin 1,100 Units/hr (03/16/19 0226)   [id number], ondansetron (ZOFRAN) IV   ROS:                                                                                                                                       ROS was performed and is negative except as noted in HPI  General Examination:                                                                                                      Blood pressure (!) 154/90, pulse 67, temperature 98.2 F (36.8 C), temperature source Oral, resp. rate 18, height 6\' 1"  (1.854 m), weight 89.8 kg, SpO2 99 %.  HEENT-  Normocephalic, no  lesions, without obvious abnormality.  Normal external eye and conjunctiva. Cardiovascular- S1-S2 audible, pulses palpable throughout  Lungs-no rhonchi or wheezing noted,  no excessive working breathing.  Saturations within normal limits on RA Abdomen- All 4 quadrants palpated and non tender Extremities- Warm, dry and intact Musculoskeletal-left hand missing a finger Skin-warm and dry, no hyperpigmentation, vitiligo, or suspicious lesions  Neurological Examination Mental Status: Alert, oriented, thought content appropriate.  Speech fluent without evidence of aphasia.  No dysarthria noted. Naming intact. Able to follow  commands without difficulty. Cranial Nerves: II:  Visual fields grossly normal,  III,IV, VI: ptosis not present, extra-ocular motions intact bilaterally, pupils equal, round, reactive to light and accommodation V,VII: Mild lag on left smile, facial light touch sensation normal bilaterally VIII: hearing normal bilaterally IX,X: uvula rises midline XI: bilateral shoulder shrug XII: midline tongue extension Motor: Right : Upper extremity   5/5  Left:     Upper extremity   5/5  Lower extremity   5/5   Lower extremity   5/5 Tone and bulk:normal tone throughout; no atrophy noted Sensory:  light touch intact throughout, bilaterally Deep Tendon Reflexes: 2+ and symmetric biceps and patella Plantars: Right: downgoing   Left: downgoing Cerebellar: normal finger-to-nose,  normal heel-to-shin test Gait: deferred   Lab Results: Basic Metabolic Panel: Recent Labs  Lab 03/15/19 2317  NA 136  K 4.1  CL 101  CO2 26  GLUCOSE 108*  BUN 20  CREATININE 0.86  CALCIUM 9.2    CBC: Recent Labs  Lab 03/15/19 2317  WBC 5.9  NEUTROABS 3.0  HGB 14.6  HCT 42.7  MCV 98.6  PLT 197    CBG: Recent Labs  Lab 03/15/19 2313  GLUCAP 108*    Imaging: Ct Head Wo Contrast  Result Date: 03/16/2019 IMPRESSION: 1. Small area of hypoattenuation in the cortical and subcortical  matter of the right parietal lobe. This likely represents an age-indeterminate ischemic infarct. 2. No evidence of intracranial hemorrhage. Electronically Signed   By: Fidela Salisbury M.D.   On: 03/16/2019 00:52   Mr Brain Wo Contrast  Result Date: 03/16/2019  None IMPRESSION: Acute infarct right MCA territory. Multiple small areas of acute infarct in the right frontal parietal cortex and white matter with sparing of the basal ganglia. Negative for hemorrhage. Electronically Signed   By: Franchot Gallo M.D.   On: 03/16/2019 11:14   Mr Cervical Spine Wo Contrast  Result Date: 03/16/2019 . IMPRESSION: No acute abnormality. Multilevel disc and facet degeneration throughout the cervical spine. Multilevel spinal and foraminal stenosis as detailed above. Severe left foraminal encroachment C3-4 and C4-5 and moderate left foraminal encroachment at C6-7 due to spurring. Small left vertebral artery with increased signal on gradient echo imaging suggesting slow flow possibly due to proximal stenosis. Electronically Signed   By: Franchot Gallo M.D.   On: 03/16/2019 11:18       Laurey Morale, MSN, NP-C [facility] [phone]  03/16/2019, 12:02 PM   Attending physician note to follow with Assessment and plan .   Assessment: 83 y.o. male  With PMH of HTN, HLD, prostate cancer ( s/p radiation) who presented to [facility] on 03/15/2019 with c/o weakness and chest pain. Neurology consulted for subacute infarct seen on MRI. TPa not given d/t patient presenting outside of the window. CTH:  Small area of hypoattenuation in the cortical and subcortical matter of the right parietal lobe MRI: Multiple small areas of acute infarct in the right frontal parietal cortex. Complete stroke work-up needed.   Given his history of palpitations, I am concerned that he may be having intermittent atrial fibrillation.  I would favor  prolonged event monitor and/or ILR if no other clear cause found.  Stroke Risk  Factors - hyperlipidemia and hypertension    Recommendations: -- BP goal : Permissive HTN upto 220/110 mmHg  --MRI Brain  --CTA Head --Echocardiogram -- ASA -- High intensity Statin if LDL > 70 -- HgbA1c, fasting lipid panel -- PT consult, OT consult, Speech consult --Telemetry monitoring --Frequent neuro checks --Stroke swallow screen    --please page stroke NP  Or  PA  Or MD from 8am -4 pm  as this patient from this time will be  followed by the stroke.   You can look them up on [email or web address]  Password [email or web address]  Roland Rack, MD [facility] [phone]  If 7pm- 7am, please page neurology on call as listed in [place].

## 2019-03-16 NOTE — ED Provider Notes (Signed)
Emergency Department Provider Note   I have reviewed the triage vital signs and the nursing notes.   HISTORY  Chief Complaint No chief complaint on file.   HPI Brandon Brown is a 83 y.o. male with a past medical history of prostate cancer, hypertension on medications who presents the emergency department today with multiple complaints.  Patient states he was mowing for the first time this year on Tuesday for about 45 minutes and afterwards he had a couple beers but was significantly more fatigued than he would have expected.  He went about his evening and then that night he had a sudden onset of palpitations that lasted for few minutes and went away on their own.  Patient was more sleepy than normal and then 2 days ago he noticed he had burning sensation in both of his arms seem to be more around his forearms and then that evening he felt like he had some numbness and tingling in the left corner of his mouth and his wife said that he was slurring his speech so he went to the fire department and it had resolved but he was hypertensive.  His grandson was visiting today and is concerned about the symptoms possibly being related to a stroke so brought him here for further evaluation.  Review of systems patient states he also has had some dyspnea on exertion and dyspnea but no chest pain.  No nausea or vomiting.  No rashes.  No fevers or other recent illnesses.   No other associated or modifying symptoms.    Past Medical History:  Diagnosis Date   Hx of prostatitis    Hyperlipidemia    Hypertension    Prostate cancer [facility])     Patient Active Problem List   Diagnosis Date Noted   Chest pain 03/16/2019   Benign essential HTN 03/16/2019   Suspected cerebrovascular accident (CVA) 03/16/2019   Acute CVA (cerebrovascular accident) ([place]) 03/16/2019   Prostate cancer ([place]) 12/05/2013    Past Surgical History:  Procedure Laterality Date   FEMUR FRACTURE SURGERY     HERNIA  REPAIR     INCISION / DRAINAGE HAND / FINGER     KNEE ARTHROSCOPY     PROSTATE BIOPSY     ROTATOR CUFF REPAIR     TRANSURETHRAL RESECTION OF PROSTATE     TRANSURETHRAL RESECTION OF PROSTATE     tunica vaginalis excision of hydrocele right      Current Outpatient Rx   Order #: [id number] Class: Historical Med   Order #: [id number] Class: Historical Med   Order #: [id number] Class: Historical Med   Order #: [id number] Class: Historical Med   Order #: [id number] Class: Historical Med    Allergies Penicillins and Statins  Family History  Problem Relation Age of Onset   Heart disease Mother    Non-Hodgkin's lymphoma Mother     Social History Social History   Tobacco Use   Smoking status: Never Smoker   Smokeless tobacco: Never Used  Substance Use Topics   Alcohol use: No   Drug use: No    Review of Systems  All other systems negative except as documented in the HPI. All pertinent positives and negatives as reviewed in the HPI. ____________________________________________   PHYSICAL EXAM:  VITAL SIGNS: ED Triage Vitals  Enc Vitals Group     BP 03/15/19 2301 (!) 186/98     Pulse Rate 03/15/19 2301 87     Resp 03/15/19 2301 18     Temp 03/15/19 2301 98.5  F (36.9 C)     Temp Source 03/15/19 2301 Oral     SpO2 03/15/19 2301 96 %     Weight 03/15/19 2301 198 lb (89.8 kg)     Height 03/15/19 2301 6\' 1"  (1.854 m)    Constitutional: Alert and oriented. Well appearing and in no acute distress. Eyes: Conjunctivae are normal. PERRL. EOMI. Head: Atraumatic. Nose: No congestion/rhinnorhea. Mouth/Throat: Mucous membranes are moist.  Oropharynx non-erythematous. Neck: No stridor.  No meningeal signs.   Cardiovascular: Normal rate, regular rhythm. Good peripheral circulation. III/VI systolic heart murmur.   Respiratory: Normal respiratory effort.  No retractions. Lungs CTAB. Gastrointestinal: Soft and nontender. No distention.  Musculoskeletal: No lower  extremity tenderness nor edema. No gross deformities of extremities. Neurologic:  Normal speech and language. No gross focal neurologic deficits are appreciated.  Skin:  Skin is warm, dry and intact. No rash noted.   ____________________________________________   LABS (all labs ordered are listed, but only abnormal results are displayed)  Labs Reviewed  COMPREHENSIVE METABOLIC PANEL - Abnormal; Notable for the following components:      Result Value   Glucose, Bld 108 (*)    All other components within normal limits  TROPONIN I - Abnormal; Notable for the following components:   Troponin I 0.05 (*)    All other components within normal limits  D-DIMER, QUANTITATIVE (NOT AT [facility]) - Abnormal; Notable for the following components:   D-Dimer, Quant 0.71 (*)    All other components within normal limits  CBG MONITORING, ED - Abnormal; Notable for the following components:   Glucose-Capillary 108 (*)    All other components within normal limits  PROTIME-INR  APTT  CBC  DIFFERENTIAL  RAPID URINE DRUG SCREEN, HOSP PERFORMED  URINALYSIS, ROUTINE W REFLEX MICROSCOPIC   ____________________________________________  EKG   EKG Interpretation  Date/Time:  Saturday March 15 2019 23:13:18 EDT Ventricular Rate:  88 PR Interval:    QRS Duration: 95 QT Interval:  364 QTC Calculation: 441 R Axis:   74 Text Interpretation:  Sinus rhythm Probable left atrial enlargement Borderline repolarization abnormality inverted t waves inferiorly st depressions laterally No old tracing to compare Confirmed by Merrily Pew [phone]) on 03/15/2019 11:22:50 PM       ____________________________________________  RADIOLOGY  Ct Head Wo Contrast  Result Date: 03/16/2019 CLINICAL DATA:  Hypertension. Confusion. EXAM: CT HEAD WITHOUT CONTRAST TECHNIQUE: Contiguous axial images were obtained from the base of the skull through the vertex without intravenous contrast. COMPARISON:  None. FINDINGS: Brain: No  evidence of intracranial hemorrhage, hydrocephalus, extra-axial collection or mass lesion/mass effect. There is a small area of hypoattenuation in the cortical and subcortical matter of the right parietal lobe. Moderate brain parenchymal volume loss and deep white matter microangiopathy. Vascular: Calcific atherosclerotic disease. Skull: Normal. Negative for fracture or focal lesion. Sinuses/Orbits: No acute finding. Other: None. IMPRESSION: 1. Small area of hypoattenuation in the cortical and subcortical matter of the right parietal lobe. This likely represents an age-indeterminate ischemic infarct. 2. No evidence of intracranial hemorrhage. Electronically Signed   By: Fidela Salisbury M.D.   On: 03/16/2019 00:52   ____________________________________________   PROCEDURES  Procedure(s) performed:   Procedures  CRITICAL CARE Performed by: Merrily Pew Total critical care time: 35 minutes Critical care time was exclusive of separately billable procedures and treating other patients. Critical care was necessary to treat or prevent imminent or life-threatening deterioration. Critical care was time spent personally by me on the following activities: development of treatment plan  with patient and/or surrogate as well as nursing, discussions with consultants, evaluation of patient's response to treatment, examination of patient, obtaining history from patient or surrogate, ordering and performing treatments and interventions, ordering and review of laboratory studies, ordering and review of radiographic studies, pulse oximetry and re-evaluation of patient's condition.  ____________________________________________   INITIAL IMPRESSION / ASSESSMENT AND PLAN / ED COURSE  Very well could just be dehydration that caused the symptoms the first night versus electrolyte abnormality so we will check those however be more concerned for paroxysmal atrial fibrillation or something similar to that.  With his  weakness and exertional dyspnea recently concern for possible cardiac causes versus a TIA when he had the slurred speech.  Will evaluate for any evidence of stroke or persistent heart damage if these are negative I suspect the patient can be discharged to follow-up as an outpatient with blood pressure modifications.  Mildly elevated troponin could be from his high blood pressure but more concern for decreasing after cardiac event.  Also found to have a hypoattenuation in his brain making stroke very likely so think possibly A. fib with a stroke so will discuss with hospitalist for admission.  Hospitalist will start heparin.  Pertinent labs & imaging results that were available during my care of the patient were reviewed by me and considered in my medical decision making (see chart for details).  ____________________________________________  FINAL CLINICAL IMPRESSION(S) / ED DIAGNOSES  Final diagnoses:  Palpitations  TIA (transient ischemic attack)  NSTEMI (non-ST elevated myocardial infarction) ([place])     MEDICATIONS GIVEN DURING THIS VISIT:  Medications  heparin ADULT infusion 100 units/mL (25000 units/2103mL sodium chloride 0.45%) (1,100 Units/hr Intravenous New Bag/Given 03/16/19 0226)  heparin bolus via infusion 4,000 Units (4,000 Units Intravenous Bolus from Bag 03/16/19 0226)     NEW OUTPATIENT MEDICATIONS STARTED DURING THIS VISIT:  New Prescriptions   No medications on file    Note:  This note was prepared with assistance of Dragon voice recognition software. Occasional wrong-word or sound-a-like substitutions may have occurred due to the inherent limitations of voice recognition software.   Jammie Troup, Corene Cornea, MD 03/16/19 [phone]

## 2019-03-16 NOTE — Progress Notes (Signed)
  Echocardiogram 2D Echocardiogram limited has been performed due to contact precautions.  Brandon Brown 03/16/2019, 9:18 AM

## 2019-03-16 NOTE — H&P (Signed)
History and Physical   FREEMAN BORBA UKG:[id number] DOB: 11-25-1936 DOA: 03/15/2019  Referring MD/NP/PA: Dr. Dayna Barker  PCP: Townsend Roger, MD   Outpatient Specialists: Dr. Cristie Hem urology  Patient coming from: Home  Chief Complaint: Weakness and chest pain  HPI: Brandon Brown is a 83 y.o. male with medical history significant of prostate cancer, hypertension who was doing well until Monday when he mowed his grass.  He felt weak and generally unwell afterwards.  The next day he felt palpitation and also very weak.  He took couple of beers and laid down but by morning time he was still feeling weak.  His left arm was a little heavier.  Patient was noted to be having speech difficulties on that day.  He also continues to act off from his baseline.  His grandson visited him today and after conversation advised patient to come to the emergency room.  He apparently went to the fire department yesterday and was told that his blood pressure and blood sugar were elevated.  Patient has otherwise been doing fine.  He felt palpitations that lasted just a few minutes.  They went away on their own.  He has had also tingling in the left corner of his mouth.  His wife noted the slurred speech on the same day.  Patient has been found to have findings consistent with CVA which is age indeterminate on his head CT.  He has minimally elevated troponin and an abnormal EKG.  He is therefore being admitted to the hospital for rule out MI as well as subacute CVA work-up..  ED Course: Temperature 98.5 blood pressure 186/98 but currently 139/97 with pulse 88 respirate of 18 oxygen sat 93% on room air.  CBC and chemistry appeared to be all within normal.  Troponin 0 0.05 and glucose 108.  Urinalysis and urine drug screen are all negative.  Head CT without contrast showed small area of hypoattenuation in the cortical and subcortical matter of the right parietal lobe probably age indeterminate ischemic infarct. EKG shows sinus  rhythm with ectopic beats, diffuse ST changes especially in the lateral leads with some Q waves.  No old EKG to compare.  Review of Systems: As per HPI otherwise 10 point review of systems negative.    Past Medical History:  Diagnosis Date   Hx of prostatitis    Hyperlipidemia    Hypertension    Prostate cancer [facility])     Past Surgical History:  Procedure Laterality Date   FEMUR FRACTURE SURGERY     HERNIA REPAIR     INCISION / DRAINAGE HAND / FINGER     KNEE ARTHROSCOPY     PROSTATE BIOPSY     ROTATOR CUFF REPAIR     TRANSURETHRAL RESECTION OF PROSTATE     TRANSURETHRAL RESECTION OF PROSTATE     tunica vaginalis excision of hydrocele right       reports that he has never smoked. He has never used smokeless tobacco. He reports that he does not drink alcohol or use drugs.  Allergies  Allergen Reactions   Penicillins Other (See Comments)    Unknown Did it involve swelling of the face/tongue/throat, SOB, or low BP? Unknown Did it involve sudden or severe rash/hives, skin peeling, or any reaction on the inside of your mouth or nose? Unknown Did you need to seek medical attention at a hospital or doctor's office? Unknown When did it last happen? Unknown  If all above answers are NO, may proceed with  cephalosporin use.    Statins Other (See Comments)    Joints, muscles ache    Family History  Problem Relation Age of Onset   Heart disease Mother    Non-Hodgkin's lymphoma Mother      Prior to Admission medications   Medication Sig Start Date End Date Taking? Authorizing Provider  amLODipine (NORVASC) 2.5 MG tablet Take 2.5 mg by mouth daily after breakfast. 01/20/19   [provider]  hydrocortisone (ANUSOL-HC) 25 MG suppository Place 1 suppository (25 mg total) rectally 2 (two) times daily as needed (pain, bleeding). 05/07/14   Tyler Pita, MD  lisinopril (PRINIVIL,ZESTRIL) 30 MG tablet Take 40 mg by mouth daily.     [provider]   lisinopril (PRINIVIL,ZESTRIL) 40 MG tablet Take 40 mg by mouth daily after breakfast. 02/08/19   [provider]  sildenafil (VIAGRA) 100 MG tablet Take 100 mg by mouth daily as needed for erectile dysfunction.    [provider]    Physical Exam: Vitals:   03/16/19 0000 03/16/19 0030 03/16/19 0100 03/16/19 0130  BP: (!) 142/99 (!) 134/94 135/89 (!) 129/97  Pulse: 86 72 70 79  Resp: 16 17 11 17   Temp:      TempSrc:      SpO2: 94% 94% 93% 93%  Weight:      Height:          Constitutional: NAD, calm, comfortable Vitals:   03/16/19 0000 03/16/19 0030 03/16/19 0100 03/16/19 0130  BP: (!) 142/99 (!) 134/94 135/89 (!) 129/97  Pulse: 86 72 70 79  Resp: 16 17 11 17   Temp:      TempSrc:      SpO2: 94% 94% 93% 93%  Weight:      Height:       Eyes: PERRL, lids and conjunctivae normal ENMT: Mucous membranes are moist. Posterior pharynx clear of any exudate or lesions.Normal dentition.  Neck: normal, supple, no masses, no thyromegaly Respiratory: clear to auscultation bilaterally, no wheezing, no crackles. Normal respiratory effort. No accessory muscle use.  Cardiovascular: Irregular rate and rhythm, new systolic murmur / rubs / gallops. No extremity edema. 2+ pedal pulses. No carotid bruits.  Abdomen: no tenderness, no masses palpated. No hepatosplenomegaly. Bowel sounds positive.  Musculoskeletal: no clubbing / cyanosis. No joint deformity upper and lower extremities. Good ROM, no contractures. Normal muscle tone.  Skin: no rashes, lesions, ulcers. No induration Neurologic: CN 2-12 grossly intact. Sensation intact, DTR normal. Strength 5/5 in all 4.  Mild left facial droop Psychiatric: Normal judgment and insight. Alert and oriented x 3. Normal mood.     Labs on Admission: I have personally reviewed following labs and imaging studies  CBC: Recent Labs  Lab 03/15/19 2317  WBC 5.9  NEUTROABS 3.0  HGB 14.6  HCT 42.7  MCV 98.6  PLT 973   Basic Metabolic  Panel: Recent Labs  Lab 03/15/19 2317  NA 136  K 4.1  CL 101  CO2 26  GLUCOSE 108*  BUN 20  CREATININE 0.86  CALCIUM 9.2   GFR: Estimated Creatinine Clearance: 77.4 mL/min (by C-G formula based on SCr of 0.86 mg/dL). Liver Function Tests: Recent Labs  Lab 03/15/19 2317  AST 28  ALT 25  ALKPHOS 70  BILITOT 0.6  PROT 7.5  ALBUMIN 4.4   No results for input(s): LIPASE, AMYLASE in the last 168 hours. No results for input(s): AMMONIA in the last 168 hours. Coagulation Profile: Recent Labs  Lab 03/15/19 2317  INR 1.0  Cardiac Enzymes: Recent Labs  Lab 03/15/19 2317  TROPONINI 0.05*   BNP (last 3 results) No results for input(s): PROBNP in the last 8760 hours. HbA1C: No results for input(s): HGBA1C in the last 72 hours. CBG: Recent Labs  Lab 03/15/19 2313  GLUCAP 108*   Lipid Profile: No results for input(s): CHOL, HDL, LDLCALC, TRIG, CHOLHDL, LDLDIRECT in the last 72 hours. Thyroid Function Tests: No results for input(s): TSH, T4TOTAL, FREET4, T3FREE, THYROIDAB in the last 72 hours. Anemia Panel: No results for input(s): VITAMINB12, FOLATE, FERRITIN, TIBC, IRON, RETICCTPCT in the last 72 hours. Urine analysis:    Component Value Date/Time   COLORURINE YELLOW 03/15/2019 2334   APPEARANCEUR CLEAR 03/15/2019 2334   LABSPEC 1.012 03/15/2019 2334   PHURINE 5.0 03/15/2019 2334   GLUCOSEU NEGATIVE 03/15/2019 2334   HGBUR NEGATIVE 03/15/2019 2334   BILIRUBINUR NEGATIVE 03/15/2019 2334   KETONESUR NEGATIVE 03/15/2019 2334   PROTEINUR NEGATIVE 03/15/2019 2334   NITRITE NEGATIVE 03/15/2019 2334   LEUKOCYTESUR NEGATIVE 03/15/2019 2334   Sepsis Labs: @LABRCNTIP (procalcitonin:4,lacticidven:4) )No results found for this or any previous visit (from the past 240 hour(s)).   Radiological Exams on Admission: Ct Head Wo Contrast  Result Date: 03/16/2019 CLINICAL DATA:  Hypertension. Confusion. EXAM: CT HEAD WITHOUT CONTRAST TECHNIQUE: Contiguous axial images were  obtained from the base of the skull through the vertex without intravenous contrast. COMPARISON:  None. FINDINGS: Brain: No evidence of intracranial hemorrhage, hydrocephalus, extra-axial collection or mass lesion/mass effect. There is a small area of hypoattenuation in the cortical and subcortical matter of the right parietal lobe. Moderate brain parenchymal volume loss and deep white matter microangiopathy. Vascular: Calcific atherosclerotic disease. Skull: Normal. Negative for fracture or focal lesion. Sinuses/Orbits: No acute finding. Other: None. IMPRESSION: 1. Small area of hypoattenuation in the cortical and subcortical matter of the right parietal lobe. This likely represents an age-indeterminate ischemic infarct. 2. No evidence of intracranial hemorrhage. Electronically Signed   By: Fidela Salisbury M.D.   On: 03/16/2019 00:52    EKG: Independently reviewed.  It shows sinus rhythm with a rate of 88, normal intervals, visible Q waves, ST depression in the inferolateral leads with some T wave inversions.  No old EKG to compare  Assessment/Plan Principal Problem:   Chest pain Active Problems:   Prostate cancer ([facility])   Benign essential HTN   Suspected cerebrovascular accident (CVA)   Acute CVA (cerebrovascular accident) ([place])     #1 chest pain: Patient has had some exertional dyspnea since his episodes of palpitation about 4 days ago.  He has had some tingling of the left arm.  Minimally elevated troponin and abnormal EKG.  We will admit him to cardiac telemetry.  Rule out MI by serial enzymes.  Get echocardiogram.  If echo is abnormal patient will need cardiology consult for ischemic work-up.  #2 acute to subacute CVA: Historically patient symptoms happened 3 days ago.  He has age-indeterminate infarct on CT head.  We will get MRI of the brain and if infarct is confirmed we will get MRA or carotid Dopplers.  He is getting echocardiogram already.  #3 hypertension: Continue with blood  pressure medication from home.  Currently on lisinopril.  Consider beta-blockers.  #4 history of prostate cancer: Being followed by urology.  No change in therapy   DVT prophylaxis: Heparin Code Status: Full code Family Communication: Grandson with patient in the room Disposition Plan: Home Consults called: None at the moment Admission status: Observation  Severity of Illness: The appropriate  patient status for this patient is OBSERVATION. Observation status is judged to be reasonable and necessary in order to provide the required intensity of service to ensure the patient's safety. The patient's presenting symptoms, physical exam findings, and initial radiographic and laboratory data in the context of their medical condition is felt to place them at decreased risk for further clinical deterioration. Furthermore, it is anticipated that the patient will be medically stable for discharge from the hospital within 2 midnights of admission. The following factors support the patient status of observation.   " The patient's presenting symptoms include chest pain with some weakness. " The physical exam findings include mild left facial droop. " The initial radiographic and laboratory data are head CT showing evidence of CVA.     Barbette Merino MD [facility] Pager [phone]  If 7PM-7AM, please contact night-coverage [email or web address] Password [email or web address]  03/16/2019, 2:05 AM

## 2019-03-16 NOTE — Evaluation (Signed)
Speech Language Pathology Evaluation Patient Details Name: Brandon Brown MRN: [id number] DOB: 03/31/1936 Today's Date: 03/16/2019 Time: 3086-5784 SLP Time Calculation (min) (ACUTE ONLY): 33 min  Problem List:  Patient Active Problem List   Diagnosis Date Noted  . Chest pain 03/16/2019  . Benign essential HTN 03/16/2019  . Suspected cerebrovascular accident (CVA) 03/16/2019  . Acute CVA (cerebrovascular accident) ([place]) 03/16/2019  . Prostate cancer ([place]) 12/05/2013   Past Medical History:  Past Medical History:  Diagnosis Date  . Hx of prostatitis   . Hyperlipidemia   . Hypertension   . Prostate cancer [facility])    Past Surgical History:  Past Surgical History:  Procedure Laterality Date  . FEMUR FRACTURE SURGERY    . HERNIA REPAIR    . INCISION / DRAINAGE HAND / FINGER    . KNEE ARTHROSCOPY    . PROSTATE BIOPSY    . ROTATOR CUFF REPAIR    . TRANSURETHRAL RESECTION OF PROSTATE    . TRANSURETHRAL RESECTION OF PROSTATE    . tunica vaginalis excision of hydrocele right     HPI:  Pt is an 83 y.o. male with medical history significant of prostate cancer, hypertension who was admitted secondary to weakneess and chest pain. The MRI of 03/16/19 revealed acute infarct right MCA territory. Multiple small areas of acute infarct in the right frontal parietal cortex and white matter with sparing of the basal ganglia.    Assessment / Plan / Recommendation Clinical Impression  Pt participated in speech/language/cognition evaluation and he denied any prior or new deficits in these areas. [facility] Cognitive Assessment 8.1 was completed to evaluate the pt's cognitive-linguistic skills. He achieved a score of 27/30 which is within the normal limits of 26 or more out of 30 and no speech/language deficits were demonstrated. Further skilled SLP services are not clinically indicated at this time. Pt and nursing were educated regarding this and both parties verbalized understanding as well as  agreement with plan of care.    SLP Assessment  SLP Recommendation/Assessment: Patient does not need any further Speech Lanaguage Pathology Services SLP Visit Diagnosis: Cognitive communication deficit (R41.841)    Follow Up Recommendations  None    Frequency and Duration           SLP Evaluation Cognition  Overall Cognitive Status: Within Functional Limits for tasks assessed Arousal/Alertness: Awake/alert Orientation Level: Oriented X4 Attention: Focused;Sustained Focused Attention: Appears intact(Vigilance WNL: 1/1) Sustained Attention: Appears intact(Serial 7s: 3/3) Memory: Appears intact(Immediate: 4/5; Delayed: 5/5) Awareness: Appears intact Problem Solving: Appears intact(4/4) Executive Function: Reasoning;Sequencing Reasoning: Impaired Reasoning Impairment: Verbal complex(Abstraction: 1/2) Sequencing: Appears intact(Clock drawing: 3/3)       Comprehension  Auditory Comprehension Overall Auditory Comprehension: Appears within functional limits for tasks assessed Yes/No Questions: Within Functional Limits Commands: Within Functional Limits(Complex commands- trail completion: 1/1) Reading Comprehension Reading Status: Not tested    Expression Expression Primary Mode of Expression: Verbal Verbal Expression Overall Verbal Expression: Appears within functional limits for tasks assessed Initiation: No impairment Level of Generative/Spontaneous Verbalization: Sentence;Conversation Repetition: Impaired Level of Impairment: Sentence level(1/2) Responsive: Not tested Confrontation: Impaired(2/3) Divergent: (Verbal fluency: 1/1) Pragmatics: No impairment Written Expression Dominant Hand: Right Written Expression: (Copying cube: 1/1)   Oral / Motor  Motor Speech Overall Motor Speech: Appears within functional limits for tasks assessed Respiration: Within functional limits Phonation: Normal Resonance: Within functional limits Articulation: Within functional  limitis Intelligibility: Intelligible Motor Planning: Witnin functional limits Motor Speech Errors: Not applicable   Lamone Ferrelli I. Hardin Negus, [place], [place]  [place] number [phone] Pager [place] 03/16/2019, 4:35 PM

## 2019-03-16 NOTE — Progress Notes (Addendum)
Carotid duplex has been completed.   Preliminary results in CV Proc.   Abram Sander 03/16/2019 1:41 PM

## 2019-03-16 NOTE — ED Notes (Signed)
Report given to [place], Therapist, sports from [employer].

## 2019-03-17 ENCOUNTER — Encounter (HOSPITAL_COMMUNITY)
Admission: EM | Disposition: A | Discharge status: Home / Self Care | Payer: Self-pay | Source: Home / Self Care | Attending: Internal Medicine

## 2019-03-17 ENCOUNTER — Inpatient Hospital Stay (HOSPITAL_COMMUNITY): Payer: Medicare Other

## 2019-03-17 ENCOUNTER — Encounter (HOSPITAL_COMMUNITY): Payer: Self-pay | Admitting: *Deleted

## 2019-03-17 DIAGNOSIS — I63511 Cerebral infarction due to unspecified occlusion or stenosis of right middle cerebral artery: Principal | ICD-10-CM

## 2019-03-17 DIAGNOSIS — I6389 Other cerebral infarction: Secondary | ICD-10-CM

## 2019-03-17 DIAGNOSIS — E785 Hyperlipidemia, unspecified: Secondary | ICD-10-CM

## 2019-03-17 HISTORY — PX: LOOP RECORDER INSERTION: EP1214

## 2019-03-17 LAB — COMPREHENSIVE METABOLIC PANEL
ALT: 19 U/L (ref 0–44)
AST: 20 U/L (ref 15–41)
Albumin: 3.6 g/dL (ref 3.5–5.0)
Alkaline Phosphatase: 57 U/L (ref 38–126)
Anion gap: 10 (ref 5–15)
BUN: 14 mg/dL (ref 8–23)
CO2: 23 mmol/L (ref 22–32)
Calcium: 9 mg/dL (ref 8.9–10.3)
Chloride: 103 mmol/L (ref 98–111)
Creatinine, Ser: 0.86 mg/dL (ref 0.61–1.24)
GFR calc Af Amer: >60 mL/min (ref >60)
GFR calc non Af Amer: >60 mL/min (ref >60)
Glucose, Bld: 112 mg/dL — ABNORMAL HIGH (ref 70–99)
POTASSIUM: 3.8 mmol/L (ref 3.5–5.1)
Sodium: 136 mmol/L (ref 135–145)
Total Bilirubin: 1.1 mg/dL (ref 0.3–1.2)
Total Protein: 6.1 g/dL — ABNORMAL LOW (ref 6.5–8.1)

## 2019-03-17 LAB — LIPID PANEL
CHOL/HDL RATIO: 7 ratio
Cholesterol: 286 mg/dL — ABNORMAL HIGH (ref 0–200)
HDL: 41 mg/dL (ref >40)
LDL Cholesterol: 193 mg/dL — ABNORMAL HIGH (ref 0–99)
Triglycerides: 262 mg/dL — ABNORMAL HIGH (ref <150)
VLDL: 52 mg/dL — ABNORMAL HIGH (ref 0–40)

## 2019-03-17 LAB — CBC
HEMATOCRIT: 40 % (ref 39.0–52.0)
Hemoglobin: 14.1 g/dL (ref 13.0–17.0)
MCH: 33.9 pg (ref 26.0–34.0)
MCHC: 35.3 g/dL (ref 30.0–36.0)
MCV: 96.2 fL (ref 80.0–100.0)
Platelets: 178 10*3/uL (ref 150–400)
RBC: 4.16 MIL/uL — ABNORMAL LOW (ref 4.22–5.81)
RDW: 12 % (ref 11.5–15.5)
WBC: 4.4 10*3/uL (ref 4.0–10.5)
nRBC: 0 % (ref 0.0–0.2)

## 2019-03-17 SURGERY — LOOP RECORDER INSERTION

## 2019-03-17 MED ORDER — PRAVASTATIN SODIUM 40 MG PO TABS: 40.0000 mg | ORAL_TABLET | Freq: Every day | ORAL | Status: DC

## 2019-03-17 MED ORDER — PRAVASTATIN SODIUM 40 MG PO TABS: 40.0000 mg | ORAL_TABLET | Freq: Every day | ORAL | 2 refills | Status: DC

## 2019-03-17 MED ORDER — ASPIRIN EC 325 MG PO TBEC: 325.0000 mg | DELAYED_RELEASE_TABLET | Freq: Every day | ORAL | Status: DC

## 2019-03-17 MED ORDER — LIDOCAINE-EPINEPHRINE 1 %-1:100000 IJ SOLN
INTRAMUSCULAR | Status: DC | PRN
  Administered 2019-03-17: 10 mL

## 2019-03-17 MED ORDER — CLOPIDOGREL BISULFATE 75 MG PO TABS: 75.0000 mg | ORAL_TABLET | Freq: Every day | ORAL | 2 refills | Status: AC

## 2019-03-17 MED ORDER — LIDOCAINE-EPINEPHRINE 1 %-1:100000 IJ SOLN
INTRAMUSCULAR | Status: AC
  Filled 2019-03-17: qty 1

## 2019-03-17 MED ORDER — ASPIRIN 325 MG PO TBEC: 325.0000 mg | DELAYED_RELEASE_TABLET | Freq: Every day | ORAL | 3 refills | Status: DC

## 2019-03-17 SURGICAL SUPPLY — 2 items
LOOP REVEAL LINQSYS (Prosthesis & Implant Heart) ×2 IMPLANT
PACK LOOP INSERTION (CUSTOM PROCEDURE TRAY) ×3 IMPLANT

## 2019-03-17 NOTE — Progress Notes (Signed)
[facility] PROGRESS NOTE  Brandon Brown ZOX:[id number] DOB: July 02, 1936 DOA: 03/15/2019  PCP: Townsend Roger, MD  Brief History/Interval Summary: 83 y.o. male with medical history significant of prostate cancer, hypertension who was doing well until Monday when he mowed his grass.  He felt weak and generally unwell afterwards.  The next day he felt palpitation and also very weak.  He also mentioned numbness in both of his arms at times.  Denies it currently.  There was also mention of slurred speech.  Patient was evaluated by the fire department and was found to have elevated blood pressure and glucose levels.  He was subsequently brought into the hospital by his grandson.  CT showed an age-indeterminate infarct.  Patient was hospitalized for further management.  Reason for Visit: Generalized weakness.  Abnormal CT head.  Mildly elevated troponin.  Consultants: None yet  Procedures:   Transthoracic echocardiogram  1. The left ventricle has normal systolic function, with an ejection fraction of 55-60%. The cavity size was normal. Left ventricular diastolic Doppler parameters are consistent with impaired relaxation. No evidence of left ventricular regional wall motion abnormalities.  2. The right ventricle has normal systolc function. The cavity was normal. Right ventricular systolic pressure could not be assessed.  3. Left atrial size was mildly dilated.  4. The aortic valve is tricuspid Moderate thickening of the aortic valve Mild calcification of the aortic valve. Aortic valve regurgitation is trivial by color flow Doppler. no stenosis of the aortic valve.  Antibiotics: None  Subjective/Interval History: Patient denies any weakness in any one side of his body.  No chest pain or shortness of breath this morning.   ROS: Denies any nausea or vomiting  Objective:  Vital Signs  Vitals:   03/16/19 1954 03/16/19 2310 03/17/19 0511 03/17/19 0758  BP: (!) 167/94 120/82 136/81 (!)  142/74  Pulse: 69 67 66 60  Resp: 18 18 18 18   Temp: 98.1 F (36.7 C) 99 F (37.2 C) 98.9 F (37.2 C) 98 F (36.7 C)  TempSrc: Oral Oral Oral Oral  SpO2: 95% 94% 96% 96%  Weight:      Height:        Intake/Output Summary (Last 24 hours) at 03/17/2019 0910 Last data filed at 03/16/2019 1853 Gross per 24 hour  Intake 93.17 ml  Output 900 ml  Net -806.83 ml   Filed Weights   03/15/19 2301  Weight: 89.8 kg    General appearance: Awake alert.  In no distress Resp: Clear to auscultation bilaterally.  Normal effort Cardio: S1-S2 is normal regular.  No S3-S4.  Systolic murmur appreciated over the aortic area GI: Abdomen is soft.  Nontender nondistended.  Bowel sounds are present normal.  No masses organomegaly Extremities: No edema.  Full range of motion of lower extremities. Neurologic: Alert and oriented x3.  No focal neurological deficits.    Lab Results:  Data Reviewed: I have personally reviewed following labs and imaging studies  CBC: Recent Labs  Lab 03/15/19 2317 03/17/19 0423  WBC 5.9 4.4  NEUTROABS 3.0  --   HGB 14.6 14.1  HCT 42.7 40.0  MCV 98.6 96.2  PLT 197 811    Basic Metabolic Panel: Recent Labs  Lab 03/15/19 2317 03/17/19 0423  NA 136 136  K 4.1 3.8  CL 101 103  CO2 26 23  GLUCOSE 108* 112*  BUN 20 14  CREATININE 0.86 0.86  CALCIUM 9.2 9.0    GFR: Estimated Creatinine Clearance: 77.4 mL/min (by  C-G formula based on SCr of 0.86 mg/dL).  Liver Function Tests: Recent Labs  Lab 03/15/19 2317 03/17/19 0423  AST 28 20  ALT 25 19  ALKPHOS 70 57  BILITOT 0.6 1.1  PROT 7.5 6.1*  ALBUMIN 4.4 3.6     Coagulation Profile: Recent Labs  Lab 03/15/19 2317  INR 1.0    Cardiac Enzymes: Recent Labs  Lab 03/15/19 2317 03/16/19 1120  TROPONINI 0.05* 0.03*    CBG: Recent Labs  Lab 03/15/19 2313  GLUCAP 108*     Radiology Studies: Ct Angio Head W Or Wo Contrast  Result Date: 03/16/2019 CLINICAL DATA:  Follow-up acute right  MCA territory infarct. EXAM: CT ANGIOGRAPHY HEAD AND NECK TECHNIQUE: Multidetector CT imaging of the head and neck was performed using the standard protocol during bolus administration of intravenous contrast. Multiplanar CT image reconstructions and MIPs were obtained to evaluate the vascular anatomy. Carotid stenosis measurements (when applicable) are obtained utilizing NASCET criteria, using the distal internal carotid diameter as the denominator. CONTRAST:  18mL OMNIPAQUE IOHEXOL 350 MG/ML SOLN COMPARISON:  CT head without contrast 03/16/19 FINDINGS: CTA NECK FINDINGS Aortic arch: Atherosclerotic calcifications are present within the aortic arch and at the origins the great vessels without focal aneurysm or stenosis. Right carotid system: The right common carotid artery demonstrates change without focal stenosis. There are dense calcifications at the proximal right ICA. There is no significant stenosis of greater than 50% relative to the more distal vessel. Left carotid system: Atherosclerotic calcifications are present within the distal left common carotid artery and through the bifurcation without a significant stenosis relative to the more distal vessel. Vertebral arteries: There is a 50% stenosis of the right vertebral artery at its origin. The left vertebral artery is hypoplastic. There is no significant stenosis of either vertebral artery in the neck. Minimal calcifications are present in the right vertebral artery at the C1 level. Skeleton: Slight anterolisthesis present at C4-5 and C5-6. There are chronic endplate change M0-1 with fusion. Multilevel facet spurring is evident bilaterally. No focal lytic or blastic lesions are present. Other neck: Soft tissues the neck are otherwise unremarkable. No focal mucosal or submucosal lesions are present. Thyroid is within normal limits. Salivary glands are within normal limits. Upper chest: Lung apices are clear. Thoracic inlet is within normal limits. Review of  the MIP images confirms the above findings CTA HEAD FINDINGS Anterior circulation: Atherosclerotic calcifications are present within the cavernous internal carotid arteries bilaterally. There is no significant stenosis through the ICA termini. The A1 segments are within normal limits bilaterally. No anterior communicating artery is present. The left M1 segment is normal. There is a high-grade stenosis at the proximal right M1 segment with marked attenuation of distal right MCA branch vessels. Posterior circulation: There is moderate stenosis of the left V4 segment. PICA origins are visualized and normal. Vertebrobasilar junction is normal. The basilar artery is small. Both posterior cerebral arteries originate from basilar tip. There is a high-grade stenosis of the right P2 segment. There is attenuation of distal branch vessels bilaterally. Venous sinuses: The dural sinuses are patent. Right transverse sinus is dominant. Straight sinus and deep cerebral veins are intact. Cortical veins are unremarkable. Anatomic variants: None Delayed phase: Delayed images demonstrate no pathologic enhancement. Right MCA territory infarcts are noted. Review of the MIP images confirms the above findings IMPRESSION: 1. High-grade stenosis of the proximal right M1 segment, likely accounting for the infarcts. 2. Atherosclerotic changes at the carotid bifurcations bilaterally without other significant  focal stenosis. 3. High-grade stenosis of the right P2 segment. Electronically Signed   By: San Morelle M.D.   On: 03/16/2019 21:24   Ct Head Wo Contrast  Result Date: 03/16/2019 CLINICAL DATA:  Hypertension. Confusion. EXAM: CT HEAD WITHOUT CONTRAST TECHNIQUE: Contiguous axial images were obtained from the base of the skull through the vertex without intravenous contrast. COMPARISON:  None. FINDINGS: Brain: No evidence of intracranial hemorrhage, hydrocephalus, extra-axial collection or mass lesion/mass effect. There is a  small area of hypoattenuation in the cortical and subcortical matter of the right parietal lobe. Moderate brain parenchymal volume loss and deep white matter microangiopathy. Vascular: Calcific atherosclerotic disease. Skull: Normal. Negative for fracture or focal lesion. Sinuses/Orbits: No acute finding. Other: None. IMPRESSION: 1. Small area of hypoattenuation in the cortical and subcortical matter of the right parietal lobe. This likely represents an age-indeterminate ischemic infarct. 2. No evidence of intracranial hemorrhage. Electronically Signed   By: Fidela Salisbury M.D.   On: 03/16/2019 00:52   Ct Angio Neck W Or Wo Contrast  Result Date: 03/16/2019 CLINICAL DATA:  Follow-up acute right MCA territory infarct. EXAM: CT ANGIOGRAPHY HEAD AND NECK TECHNIQUE: Multidetector CT imaging of the head and neck was performed using the standard protocol during bolus administration of intravenous contrast. Multiplanar CT image reconstructions and MIPs were obtained to evaluate the vascular anatomy. Carotid stenosis measurements (when applicable) are obtained utilizing NASCET criteria, using the distal internal carotid diameter as the denominator. CONTRAST:  75mL OMNIPAQUE IOHEXOL 350 MG/ML SOLN COMPARISON:  CT head without contrast 03/16/19 FINDINGS: CTA NECK FINDINGS Aortic arch: Atherosclerotic calcifications are present within the aortic arch and at the origins the great vessels without focal aneurysm or stenosis. Right carotid system: The right common carotid artery demonstrates change without focal stenosis. There are dense calcifications at the proximal right ICA. There is no significant stenosis of greater than 50% relative to the more distal vessel. Left carotid system: Atherosclerotic calcifications are present within the distal left common carotid artery and through the bifurcation without a significant stenosis relative to the more distal vessel. Vertebral arteries: There is a 50% stenosis of the right  vertebral artery at its origin. The left vertebral artery is hypoplastic. There is no significant stenosis of either vertebral artery in the neck. Minimal calcifications are present in the right vertebral artery at the C1 level. Skeleton: Slight anterolisthesis present at C4-5 and C5-6. There are chronic endplate change O8-7 with fusion. Multilevel facet spurring is evident bilaterally. No focal lytic or blastic lesions are present. Other neck: Soft tissues the neck are otherwise unremarkable. No focal mucosal or submucosal lesions are present. Thyroid is within normal limits. Salivary glands are within normal limits. Upper chest: Lung apices are clear. Thoracic inlet is within normal limits. Review of the MIP images confirms the above findings CTA HEAD FINDINGS Anterior circulation: Atherosclerotic calcifications are present within the cavernous internal carotid arteries bilaterally. There is no significant stenosis through the ICA termini. The A1 segments are within normal limits bilaterally. No anterior communicating artery is present. The left M1 segment is normal. There is a high-grade stenosis at the proximal right M1 segment with marked attenuation of distal right MCA branch vessels. Posterior circulation: There is moderate stenosis of the left V4 segment. PICA origins are visualized and normal. Vertebrobasilar junction is normal. The basilar artery is small. Both posterior cerebral arteries originate from basilar tip. There is a high-grade stenosis of the right P2 segment. There is attenuation of distal branch vessels bilaterally.  Venous sinuses: The dural sinuses are patent. Right transverse sinus is dominant. Straight sinus and deep cerebral veins are intact. Cortical veins are unremarkable. Anatomic variants: None Delayed phase: Delayed images demonstrate no pathologic enhancement. Right MCA territory infarcts are noted. Review of the MIP images confirms the above findings IMPRESSION: 1. High-grade  stenosis of the proximal right M1 segment, likely accounting for the infarcts. 2. Atherosclerotic changes at the carotid bifurcations bilaterally without other significant focal stenosis. 3. High-grade stenosis of the right P2 segment. Electronically Signed   By: San Morelle M.D.   On: 03/16/2019 21:24   Mr Brain Wo Contrast  Result Date: 03/16/2019 CLINICAL DATA:  Stroke EXAM: MRI HEAD WITHOUT CONTRAST TECHNIQUE: Multiplanar, multiecho pulse sequences of the brain and surrounding structures were obtained without intravenous contrast. COMPARISON:  CT head 03/16/2019 FINDINGS: Brain: Acute infarct right MCA territory. Multiple small areas of restricted diffusion in the right MCA territory. Largest infarct in the right parietal lobe measures 2 cm and corresponds to the CT hypodensity. Basal ganglia preserved. No significant chronic ischemia. Negative for hemorrhage or mass. Ventricle size normal. Vascular: Normal arterial flow voids Skull and upper cervical spine: No focal bony lesion. Degenerative change at C1-2 with pannus formation. Sinuses/Orbits: Negative Other: None IMPRESSION: Acute infarct right MCA territory. Multiple small areas of acute infarct in the right frontal parietal cortex and white matter with sparing of the basal ganglia. Negative for hemorrhage. Electronically Signed   By: Franchot Gallo M.D.   On: 03/16/2019 11:14   Mr Cervical Spine Wo Contrast  Result Date: 03/16/2019 CLINICAL DATA:  Bilateral arm numbness.  Radiculopathy. EXAM: MRI CERVICAL SPINE WITHOUT CONTRAST TECHNIQUE: Multiplanar, multisequence MR imaging of the cervical spine was performed. No intravenous contrast was administered. COMPARISON:  None. FINDINGS: Alignment: Slight anterolisthesis C6-7 Vertebrae: Negative for fracture or mass. Hemangioma C6 vertebral body. Cord: Normal signal morphology of the spinal cord. Posterior Fossa, vertebral arteries, paraspinal tissues: Normal vertebral artery flow voids on T2.  Hyperintense signal in the left vertebral lumen on gradient echo imaging may represent slow flow. No paraspinous mass. Disc levels: C2-3: Left foraminal narrowing due to facet degeneration C3-4: Severe left foraminal narrowing due to marked facet hypertrophy. Mild spinal stenosis C4-5: Severe left foraminal stenosis due to marked facet hypertrophy. Moderate right foraminal narrowing. Mild spinal stenosis. C5-6: Bilateral facet degeneration with mild foraminal narrowing bilaterally C6-7: Disc degeneration and spurring. Bilateral facet degeneration. Moderate left foraminal encroachment due to spurring C7-T1: Marked facet degeneration bilaterally with moderate foraminal encroachment bilaterally. IMPRESSION: No acute abnormality. Multilevel disc and facet degeneration throughout the cervical spine. Multilevel spinal and foraminal stenosis as detailed above. Severe left foraminal encroachment C3-4 and C4-5 and moderate left foraminal encroachment at C6-7 due to spurring. Small left vertebral artery with increased signal on gradient echo imaging suggesting slow flow possibly due to proximal stenosis. Electronically Signed   By: Franchot Gallo M.D.   On: 03/16/2019 11:18   Vas US Carotid (at [place] Only)  Result Date: 03/16/2019 Carotid Arterial Duplex Study Indications:  CVA. Risk Factors: Hypertension, hyperlipidemia. Performing Technologist: Abram Sander RVS  Examination Guidelines: A complete evaluation includes B-mode imaging, spectral Doppler, color Doppler, and power Doppler as needed of all accessible portions of each vessel. Bilateral testing is considered an integral part of a complete examination. Limited examinations for reoccurring indications may be performed as noted.  Right Carotid Findings: +----------+--------+--------+--------+-----------+--------+             PSV cm/s EDV cm/s  Stenosis Describe    Comments  +----------+--------+--------+--------+-----------+--------+  CCA Prox   90       8                  homogeneous           +----------+--------+--------+--------+-----------+--------+  CCA Distal 81       16                homogeneous           +----------+--------+--------+--------+-----------+--------+  ICA Prox   114      22       1-39%    homogeneous           +----------+--------+--------+--------+-----------+--------+  ICA Distal 81       22                                      +----------+--------+--------+--------+-----------+--------+  ECA        100      9                                       +----------+--------+--------+--------+-----------+--------+ +----------+--------+-------+--------+-------------------+             PSV cm/s EDV cms Describe Arm Pressure (mmHG)  +----------+--------+-------+--------+-------------------+  Subclavian 96                                             +----------+--------+-------+--------+-------------------+ +---------+--------+--+--------+--+---------+  Vertebral PSV cm/s 52 EDV cm/s 11 Antegrade  +---------+--------+--+--------+--+---------+  Left Carotid Findings: +----------+--------+--------+--------+-----------+--------+             PSV cm/s EDV cm/s Stenosis Describe    Comments  +----------+--------+--------+--------+-----------+--------+  CCA Prox   108      14                homogeneous           +----------+--------+--------+--------+-----------+--------+  CCA Distal 109      18                homogeneous           +----------+--------+--------+--------+-----------+--------+  ICA Prox   72       16       1-39%    homogeneous           +----------+--------+--------+--------+-----------+--------+  ICA Distal 95       27                                      +----------+--------+--------+--------+-----------+--------+  ECA        100      7                                       +----------+--------+--------+--------+-----------+--------+ +----------+--------+--------+--------+-------------------+  Subclavian PSV cm/s EDV cm/s Describe Arm Pressure  (mmHG)  +----------+--------+--------+--------+-------------------+             89                                              +----------+--------+--------+--------+-------------------+ +---------+--------+--+--------+---------------+  Vertebral PSV cm/s 46 EDV cm/s Bi- directional  +---------+--------+--+--------+---------------+  Summary: Right Carotid: Velocities in the right ICA are consistent with a 1-39% stenosis. Left Carotid: Velocities in the left ICA are consistent with a 1-39% stenosis. Vertebrals: Right vertebral artery demonstrates antegrade flow. Left vertebral             artery demonstrates bidirectional flow. *See table(s) above for measurements and observations.     Preliminary      Medications:  Scheduled:  aspirin  81 mg Oral Daily   clopidogrel  75 mg Oral Daily   enoxaparin (LOVENOX) injection  40 mg Subcutaneous Q24H   lisinopril  40 mg Oral Daily   Continuous:  [id number], ondansetron (ZOFRAN) IV    Assessment/Plan:  Acute stroke Patient did have transient slurred speech and numbness in his arms.  He denies any neck pain but has had injuries when he used to play sports in his younger days.  He did have some neck issues then.  Bilateral arm numbness could also be cervical in origin.  MRI did show acute stroke in the right MCA territory.  MRI cervical spine did not show any acute changes however did show multilevel spinal and foraminal stenosis.  HbA1c 6.0.  LDL 193.  HDL 41.  Triglyceride 262.  Patient reports having had intolerances to multiple statins previously.  Could consider Zetia as an option.  Patient tells me that he is never tried that medication previously.  PT OT evaluation is pending.  Cleared by speech therapy.  CT angiogram showed high-grade stenosis of the right M1 and right P2.  No significant carotid artery disease.  Await further input from stroke team.  Currently on aspirin and Plavix.  Palpitations/dyspnea It does not appear that patient  had any significant chest discomfort.  He did have mildly elevated troponin at 0.05 which was subsequently 0.03.  EKG did show nonspecific changes in the form of T wave inversion especially in the inferior leads.  No concerning ST segment changes.  He was initially started on IV heparin.  Echocardiogram does not show any wall motion abnormalities.  Since patient did not have any chest pain his heparin was discontinued.  He will benefit from further cardiac work-up but this can be done in the outpatient setting in the next few weeks.  TSH normal.  Continue aspirin for now.  History of essential hypertension Patient takes lisinopril and amlodipine.  Continue to monitor blood pressures closely.  History of prostate cancer Followed by urology.  Not noted to be on any medications for the same.  DVT Prophylaxis: Lovenox Code Status: Full code Family Communication: Discussed with the patient Disposition Plan: Management as outlined above.  Await further stroke team input.  Waiting to be seen by PT and OT.    LOS: 1 day   Keora Eccleston [employer] on [email or web address]  03/17/2019, 9:10 AM

## 2019-03-17 NOTE — Discharge Instructions (Signed)
Implant site/wound care instructions °Keep incision clean and dry for 3 days. °You can remove outer dressing tomorrow. °Leave steri-strips (little pieces of tape) on until seen in the office for wound check appointment. °Call the office ([phone]) for redness, drainage, swelling, or fever. ° °

## 2019-03-17 NOTE — Plan of Care (Signed)
Adequate for Discharge

## 2019-03-17 NOTE — Discharge Summary (Signed)
[facility]  Physician Discharge Summary   Patient ID: Brandon Brown MRN: [id number] DOB/AGE: 1936/01/10 83 y.o.  Admit date: 03/15/2019 Discharge date: 03/17/2019  PCP: Townsend Roger, MD  DISCHARGE DIAGNOSES:  Acute stroke Hyperlipidemia Palpitations Essential hypertension History of prostate cancer    RECOMMENDATIONS FOR OUTPATIENT FOLLOW UP: 1. Loop recorder has been placed by electrophysiology 2. Patient will benefit from being referred to local cardiology for abnormal EKG and ischemic work-up in the next few weeks. 3. Aspirin and Plavix for 3 months followed by aspirin alone 4. Patient willing to try pravastatin.  If he does not tolerate this medication could consider Zetia and referral to lipid clinic 5. Please recheck lipid panel in the next few weeks to assess efficacy of treatment.    Home Health: None Equipment/Devices: None  CODE STATUS: Full code  DISCHARGE CONDITION: fair  Diet recommendation: Heart healthy  INITIAL HISTORY: 83 y.o.malewith medical history significant ofprostate cancer, hypertension who was doing well until Monday when he mowed his grass. He felt weak and generally unwell afterwards. The next day he felt palpitation and also very weak.  He also mentioned numbness in both of his arms at times.  Denies it currently.  There was also mention of slurred speech.  Patient was evaluated by the fire department and was found to have elevated blood pressure and glucose levels.  He was subsequently brought into the hospital by his grandson.  CT showed an age-indeterminate infarct.  Patient was hospitalized for further management.  Consultants:  Neurology.  Electrophysiology  Procedures:   Transthoracic echocardiogram 1. The left ventricle has normal systolic function, with an ejection fraction of 55-60%. The cavity size was normal. Left ventricular diastolic Doppler parameters are consistent with impaired relaxation. No evidence of  left ventricular regional wall motion abnormalities. 2. The right ventricle has normal systolc function. The cavity was normal. Right ventricular systolic pressure could not be assessed. 3. Left atrial size was mildly dilated. 4. The aortic valve is tricuspid Moderate thickening of the aortic valve Mild calcification of the aortic valve. Aortic valve regurgitation is trivial by color flow Doppler. no stenosis of the aortic valve.  Loop recorder placement   HOSPITAL COURSE:   Acute stroke Patient did have transient slurred speech and numbness in his arms.  He denies any neck pain but has had injuries when he used to play sports in his younger days.  He did have some neck issues then.  Bilateral arm numbness could also be cervical in origin.  MRI did show acute stroke in the right MCA territory.  MRI cervical spine did not show any acute changes however did show multilevel spinal and foraminal stenosis.  HbA1c 6.0.  LDL 193.  HDL 41.  Triglyceride 262.  Patient reports having had intolerances to multiple statins previously.  Could consider Zetia as an option.    Neurology discussed with patient and patient willing to try pravastatin for now.  Seen by PT and OT.  No need for therapy at this time.  CT angiogram showed high-grade stenosis of the right M1 and right P2.  No significant carotid artery disease.    Neurology recommends aspirin and Plavix for 3 months followed by aspirin alone.  Due to concern for embolic stroke a loop recorder was recommended which has been placed today.  At this time no need for lower extremity Doppler study as per neurology.  Palpitations/dyspnea It does not appear that patient had any significant chest discomfort.  He did have  mildly elevated troponin at 0.05 which was subsequently 0.03.  EKG did show nonspecific changes in the form of T wave inversion especially in the inferior leads.  No concerning ST segment changes.  He was initially started on IV heparin.  He did  not have significant elevation in troponin levels. Echocardiogram does not show any wall motion abnormalities.  Moderate thickening of the aortic valve is noted which could account for the murmur.  TSH was normal.  Patient may benefit from referral to cardiology for ischemic work-up eventually.   Hyperlipidemia LDL was noted to be 193.  Patient has tried statins previously and reports intolerance to it.  He developed a severe myopathy.  He remembers taking Lipitor.  Zetia was discussed with him.  This was also discussed by neurology.  After further discussions patient has decided to give another trial to statins in the form of pravastatin.  This will be started at a lower dose.  If he does not tolerate this medication then he should be referred to lipid clinic and could attempt treatment with Zetia or PCSK-9.  History of essential hypertension Continue home medications.  History of prostate cancer Followed by urology.  Not noted to be on any medications for the same.  Overall stable.  Okay for discharge home today.     PERTINENT LABS:  The results of significant diagnostics from this hospitalization (including imaging, microbiology, ancillary and laboratory) are listed below for reference.     Labs: Basic Metabolic Panel: Recent Labs  Lab 03/15/19 2317 03/17/19 0423  NA 136 136  K 4.1 3.8  CL 101 103  CO2 26 23  GLUCOSE 108* 112*  BUN 20 14  CREATININE 0.86 0.86  CALCIUM 9.2 9.0   Liver Function Tests: Recent Labs  Lab 03/15/19 2317 03/17/19 0423  AST 28 20  ALT 25 19  ALKPHOS 70 57  BILITOT 0.6 1.1  PROT 7.5 6.1*  ALBUMIN 4.4 3.6   CBC: Recent Labs  Lab 03/15/19 2317 03/17/19 0423  WBC 5.9 4.4  NEUTROABS 3.0  --   HGB 14.6 14.1  HCT 42.7 40.0  MCV 98.6 96.2  PLT 197 178   Cardiac Enzymes: Recent Labs  Lab 03/15/19 2317 03/16/19 1120  TROPONINI 0.05* 0.03*    CBG: Recent Labs  Lab 03/15/19 2313  GLUCAP 108*     IMAGING STUDIES Ct Angio  Head W Or Wo Contrast  Result Date: 03/16/2019 CLINICAL DATA:  Follow-up acute right MCA territory infarct. EXAM: CT ANGIOGRAPHY HEAD AND NECK TECHNIQUE: Multidetector CT imaging of the head and neck was performed using the standard protocol during bolus administration of intravenous contrast. Multiplanar CT image reconstructions and MIPs were obtained to evaluate the vascular anatomy. Carotid stenosis measurements (when applicable) are obtained utilizing NASCET criteria, using the distal internal carotid diameter as the denominator. CONTRAST:  1mL OMNIPAQUE IOHEXOL 350 MG/ML SOLN COMPARISON:  CT head without contrast 03/16/19 FINDINGS: CTA NECK FINDINGS Aortic arch: Atherosclerotic calcifications are present within the aortic arch and at the origins the great vessels without focal aneurysm or stenosis. Right carotid system: The right common carotid artery demonstrates change without focal stenosis. There are dense calcifications at the proximal right ICA. There is no significant stenosis of greater than 50% relative to the more distal vessel. Left carotid system: Atherosclerotic calcifications are present within the distal left common carotid artery and through the bifurcation without a significant stenosis relative to the more distal vessel. Vertebral arteries: There is a 50% stenosis of the  right vertebral artery at its origin. The left vertebral artery is hypoplastic. There is no significant stenosis of either vertebral artery in the neck. Minimal calcifications are present in the right vertebral artery at the C1 level. Skeleton: Slight anterolisthesis present at C4-5 and C5-6. There are chronic endplate change Y1-8 with fusion. Multilevel facet spurring is evident bilaterally. No focal lytic or blastic lesions are present. Other neck: Soft tissues the neck are otherwise unremarkable. No focal mucosal or submucosal lesions are present. Thyroid is within normal limits. Salivary glands are within normal limits.  Upper chest: Lung apices are clear. Thoracic inlet is within normal limits. Review of the MIP images confirms the above findings CTA HEAD FINDINGS Anterior circulation: Atherosclerotic calcifications are present within the cavernous internal carotid arteries bilaterally. There is no significant stenosis through the ICA termini. The A1 segments are within normal limits bilaterally. No anterior communicating artery is present. The left M1 segment is normal. There is a high-grade stenosis at the proximal right M1 segment with marked attenuation of distal right MCA branch vessels. Posterior circulation: There is moderate stenosis of the left V4 segment. PICA origins are visualized and normal. Vertebrobasilar junction is normal. The basilar artery is small. Both posterior cerebral arteries originate from basilar tip. There is a high-grade stenosis of the right P2 segment. There is attenuation of distal branch vessels bilaterally. Venous sinuses: The dural sinuses are patent. Right transverse sinus is dominant. Straight sinus and deep cerebral veins are intact. Cortical veins are unremarkable. Anatomic variants: None Delayed phase: Delayed images demonstrate no pathologic enhancement. Right MCA territory infarcts are noted. Review of the MIP images confirms the above findings IMPRESSION: 1. High-grade stenosis of the proximal right M1 segment, likely accounting for the infarcts. 2. Atherosclerotic changes at the carotid bifurcations bilaterally without other significant focal stenosis. 3. High-grade stenosis of the right P2 segment. Electronically Signed   By: San Morelle M.D.   On: 03/16/2019 21:24   Ct Head Wo Contrast  Result Date: 03/16/2019 CLINICAL DATA:  Hypertension. Confusion. EXAM: CT HEAD WITHOUT CONTRAST TECHNIQUE: Contiguous axial images were obtained from the base of the skull through the vertex without intravenous contrast. COMPARISON:  None. FINDINGS: Brain: No evidence of intracranial  hemorrhage, hydrocephalus, extra-axial collection or mass lesion/mass effect. There is a small area of hypoattenuation in the cortical and subcortical matter of the right parietal lobe. Moderate brain parenchymal volume loss and deep white matter microangiopathy. Vascular: Calcific atherosclerotic disease. Skull: Normal. Negative for fracture or focal lesion. Sinuses/Orbits: No acute finding. Other: None. IMPRESSION: 1. Small area of hypoattenuation in the cortical and subcortical matter of the right parietal lobe. This likely represents an age-indeterminate ischemic infarct. 2. No evidence of intracranial hemorrhage. Electronically Signed   By: Fidela Salisbury M.D.   On: 03/16/2019 00:52   Ct Angio Neck W Or Wo Contrast  Result Date: 03/16/2019 CLINICAL DATA:  Follow-up acute right MCA territory infarct. EXAM: CT ANGIOGRAPHY HEAD AND NECK TECHNIQUE: Multidetector CT imaging of the head and neck was performed using the standard protocol during bolus administration of intravenous contrast. Multiplanar CT image reconstructions and MIPs were obtained to evaluate the vascular anatomy. Carotid stenosis measurements (when applicable) are obtained utilizing NASCET criteria, using the distal internal carotid diameter as the denominator. CONTRAST:  46mL OMNIPAQUE IOHEXOL 350 MG/ML SOLN COMPARISON:  CT head without contrast 03/16/19 FINDINGS: CTA NECK FINDINGS Aortic arch: Atherosclerotic calcifications are present within the aortic arch and at the origins the great vessels without focal aneurysm  or stenosis. Right carotid system: The right common carotid artery demonstrates change without focal stenosis. There are dense calcifications at the proximal right ICA. There is no significant stenosis of greater than 50% relative to the more distal vessel. Left carotid system: Atherosclerotic calcifications are present within the distal left common carotid artery and through the bifurcation without a significant stenosis  relative to the more distal vessel. Vertebral arteries: There is a 50% stenosis of the right vertebral artery at its origin. The left vertebral artery is hypoplastic. There is no significant stenosis of either vertebral artery in the neck. Minimal calcifications are present in the right vertebral artery at the C1 level. Skeleton: Slight anterolisthesis present at C4-5 and C5-6. There are chronic endplate change S2-8 with fusion. Multilevel facet spurring is evident bilaterally. No focal lytic or blastic lesions are present. Other neck: Soft tissues the neck are otherwise unremarkable. No focal mucosal or submucosal lesions are present. Thyroid is within normal limits. Salivary glands are within normal limits. Upper chest: Lung apices are clear. Thoracic inlet is within normal limits. Review of the MIP images confirms the above findings CTA HEAD FINDINGS Anterior circulation: Atherosclerotic calcifications are present within the cavernous internal carotid arteries bilaterally. There is no significant stenosis through the ICA termini. The A1 segments are within normal limits bilaterally. No anterior communicating artery is present. The left M1 segment is normal. There is a high-grade stenosis at the proximal right M1 segment with marked attenuation of distal right MCA branch vessels. Posterior circulation: There is moderate stenosis of the left V4 segment. PICA origins are visualized and normal. Vertebrobasilar junction is normal. The basilar artery is small. Both posterior cerebral arteries originate from basilar tip. There is a high-grade stenosis of the right P2 segment. There is attenuation of distal branch vessels bilaterally. Venous sinuses: The dural sinuses are patent. Right transverse sinus is dominant. Straight sinus and deep cerebral veins are intact. Cortical veins are unremarkable. Anatomic variants: None Delayed phase: Delayed images demonstrate no pathologic enhancement. Right MCA territory infarcts are  noted. Review of the MIP images confirms the above findings IMPRESSION: 1. High-grade stenosis of the proximal right M1 segment, likely accounting for the infarcts. 2. Atherosclerotic changes at the carotid bifurcations bilaterally without other significant focal stenosis. 3. High-grade stenosis of the right P2 segment. Electronically Signed   By: San Morelle M.D.   On: 03/16/2019 21:24   Mr Brain Wo Contrast  Result Date: 03/16/2019 CLINICAL DATA:  Stroke EXAM: MRI HEAD WITHOUT CONTRAST TECHNIQUE: Multiplanar, multiecho pulse sequences of the brain and surrounding structures were obtained without intravenous contrast. COMPARISON:  CT head 03/16/2019 FINDINGS: Brain: Acute infarct right MCA territory. Multiple small areas of restricted diffusion in the right MCA territory. Largest infarct in the right parietal lobe measures 2 cm and corresponds to the CT hypodensity. Basal ganglia preserved. No significant chronic ischemia. Negative for hemorrhage or mass. Ventricle size normal. Vascular: Normal arterial flow voids Skull and upper cervical spine: No focal bony lesion. Degenerative change at C1-2 with pannus formation. Sinuses/Orbits: Negative Other: None IMPRESSION: Acute infarct right MCA territory. Multiple small areas of acute infarct in the right frontal parietal cortex and white matter with sparing of the basal ganglia. Negative for hemorrhage. Electronically Signed   By: Franchot Gallo M.D.   On: 03/16/2019 11:14   Mr Cervical Spine Wo Contrast  Result Date: 03/16/2019 CLINICAL DATA:  Bilateral arm numbness.  Radiculopathy. EXAM: MRI CERVICAL SPINE WITHOUT CONTRAST TECHNIQUE: Multiplanar, multisequence MR imaging of  the cervical spine was performed. No intravenous contrast was administered. COMPARISON:  None. FINDINGS: Alignment: Slight anterolisthesis C6-7 Vertebrae: Negative for fracture or mass. Hemangioma C6 vertebral body. Cord: Normal signal morphology of the spinal cord. Posterior  Fossa, vertebral arteries, paraspinal tissues: Normal vertebral artery flow voids on T2. Hyperintense signal in the left vertebral lumen on gradient echo imaging may represent slow flow. No paraspinous mass. Disc levels: C2-3: Left foraminal narrowing due to facet degeneration C3-4: Severe left foraminal narrowing due to marked facet hypertrophy. Mild spinal stenosis C4-5: Severe left foraminal stenosis due to marked facet hypertrophy. Moderate right foraminal narrowing. Mild spinal stenosis. C5-6: Bilateral facet degeneration with mild foraminal narrowing bilaterally C6-7: Disc degeneration and spurring. Bilateral facet degeneration. Moderate left foraminal encroachment due to spurring C7-T1: Marked facet degeneration bilaterally with moderate foraminal encroachment bilaterally. IMPRESSION: No acute abnormality. Multilevel disc and facet degeneration throughout the cervical spine. Multilevel spinal and foraminal stenosis as detailed above. Severe left foraminal encroachment C3-4 and C4-5 and moderate left foraminal encroachment at C6-7 due to spurring. Small left vertebral artery with increased signal on gradient echo imaging suggesting slow flow possibly due to proximal stenosis. Electronically Signed   By: Franchot Gallo M.D.   On: 03/16/2019 11:18   Vas US Carotid (at [place] Only)  Result Date: 03/17/2019 Carotid Arterial Duplex Study Indications:  CVA. Risk Factors: Hypertension, hyperlipidemia. Performing Technologist: Abram Sander RVS  Examination Guidelines: A complete evaluation includes B-mode imaging, spectral Doppler, color Doppler, and power Doppler as needed of all accessible portions of each vessel. Bilateral testing is considered an integral part of a complete examination. Limited examinations for reoccurring indications may be performed as noted.  Right Carotid Findings: +----------+--------+--------+--------+-----------+--------+             PSV cm/s EDV cm/s Stenosis Describe    Comments   +----------+--------+--------+--------+-----------+--------+  CCA Prox   90       8                 homogeneous           +----------+--------+--------+--------+-----------+--------+  CCA Distal 81       16                homogeneous           +----------+--------+--------+--------+-----------+--------+  ICA Prox   114      22       1-39%    homogeneous           +----------+--------+--------+--------+-----------+--------+  ICA Distal 81       22                                      +----------+--------+--------+--------+-----------+--------+  ECA        100      9                                       +----------+--------+--------+--------+-----------+--------+ +----------+--------+-------+--------+-------------------+             PSV cm/s EDV cms Describe Arm Pressure (mmHG)  +----------+--------+-------+--------+-------------------+  Subclavian 96                                             +----------+--------+-------+--------+-------------------+ +---------+--------+--+--------+--+---------+  Vertebral PSV cm/s 52 EDV cm/s 11 Antegrade  +---------+--------+--+--------+--+---------+  Left Carotid Findings: +----------+--------+--------+--------+-----------+--------+             PSV cm/s EDV cm/s Stenosis Describe    Comments  +----------+--------+--------+--------+-----------+--------+  CCA Prox   108      14                homogeneous           +----------+--------+--------+--------+-----------+--------+  CCA Distal 109      18                homogeneous           +----------+--------+--------+--------+-----------+--------+  ICA Prox   72       16       1-39%    homogeneous           +----------+--------+--------+--------+-----------+--------+  ICA Distal 95       27                                      +----------+--------+--------+--------+-----------+--------+  ECA        100      7                                       +----------+--------+--------+--------+-----------+--------+  +----------+--------+--------+--------+-------------------+  Subclavian PSV cm/s EDV cm/s Describe Arm Pressure (mmHG)  +----------+--------+--------+--------+-------------------+             89                                              +----------+--------+--------+--------+-------------------+ +---------+--------+--+--------+---------------+  Vertebral PSV cm/s 46 EDV cm/s Bi- directional  +---------+--------+--+--------+---------------+  Summary: Right Carotid: Velocities in the right ICA are consistent with a 1-39% stenosis. Left Carotid: Velocities in the left ICA are consistent with a 1-39% stenosis. Vertebrals: Right vertebral artery demonstrates antegrade flow. Left vertebral             artery demonstrates bidirectional flow. *See table(s) above for measurements and observations.  Electronically signed by Antony Contras MD on 03/17/2019 at 1:33:35 PM.    Final     DISCHARGE EXAMINATION: See progress note from earlier today  DISPOSITION: Home  Discharge Instructions    Ambulatory referral to Neurology   Complete by:  As directed    An appointment is requested in approximately: 6 weeks   Call MD for:  difficulty breathing, headache or visual disturbances   Complete by:  As directed    Call MD for:  extreme fatigue   Complete by:  As directed    Call MD for:  persistant dizziness or light-headedness   Complete by:  As directed    Call MD for:  persistant nausea and vomiting   Complete by:  As directed    Call MD for:  severe uncontrolled pain   Complete by:  As directed    Call MD for:  temperature >100.4   Complete by:  As directed    Diet - low sodium heart healthy   Complete by:  As directed    Discharge instructions   Complete by:  As directed    Please take your medications as prescribed.  You should take aspirin and Plavix for 3 months followed by aspirin alone.  Please follow-up with your primary care provider.  You will need to undergo repeat lipid panel to check your  cholesterol levels in 4-6 weeks.  If you are not able to tolerate cholesterol medication your primary care provider will need to refer you to the lipid clinic.  You were cared for by a hospitalist during your hospital stay. If you have any questions about your discharge medications or the care you received while you were in the hospital after you are discharged, you can call the unit and asked to speak with the hospitalist on call if the hospitalist that took care of you is not available. Once you are discharged, your primary care physician will handle any further medical issues. Please note that NO REFILLS for any discharge medications will be authorized once you are discharged, as it is imperative that you return to your primary care physician (or establish a relationship with a primary care physician if you do not have one) for your aftercare needs so that they can reassess your need for medications and monitor your lab values. If you do not have a primary care physician, you can call [phone] for a physician referral.   Increase activity slowly   Complete by:  As directed         Allergies as of 03/17/2019      Reactions   Penicillins Other (See Comments)   Unknown Did it involve swelling of the face/tongue/throat, SOB, or low BP? Unknown Did it involve sudden or severe rash/hives, skin peeling, or any reaction on the inside of your mouth or nose? Unknown Did you need to seek medical attention at a hospital or doctor's office? Unknown When did it last happen? Unknown  If all above answers are NO, may proceed with cephalosporin use.   Statins Other (See Comments)   Joints, muscles ache      Medication List    TAKE these medications   amLODipine 2.5 MG tablet Commonly known as:  NORVASC Take 2.5 mg by mouth daily after breakfast.   APPLE CIDER VINEGAR PO Take 30 mLs by mouth daily after breakfast.   aspirin 325 MG EC tablet Take 1 tablet (325 mg total) by mouth daily. Start  taking on:  March 18, 2019   clopidogrel 75 MG tablet Commonly known as:  PLAVIX Take 1 tablet (75 mg total) by mouth daily. Start taking on:  March 17, 7424   GARLIC PO Take 2 capsules by mouth daily after breakfast.   lisinopril 40 MG tablet Commonly known as:  PRINIVIL,ZESTRIL Take 40 mg by mouth daily after breakfast.   pravastatin 40 MG tablet Commonly known as:  PRAVACHOL Take 1 tablet (40 mg total) by mouth daily at 6 PM.   sildenafil 100 MG tablet Commonly known as:  VIAGRA Take 100 mg by mouth daily as needed for erectile dysfunction.        Follow-up Information    [facility] Follow up.   Specialty:  Cardiology Why:  you will be called to schedule a wound check visit Contact information: [address] [place]       Nona Dell, Corene Cornea, MD. Schedule an appointment as soon as possible for a visit in 1 week(s).   Specialty:  Internal Medicine Contact information: [place] [address] [phone]  TOTAL DISCHARGE TIME: 35 minutes  Azaryah Oleksy [employer] on [email or web address]  03/17/2019, 2:10 PM

## 2019-03-17 NOTE — Progress Notes (Signed)
OT Cancellation    03/17/19 1100  OT Visit Information  Last OT Received On 03/17/19  Reason Eval/Treat Not Completed Patient at procedure or test/ unavailable (cath lab)  Maurie Boettcher, OT/L   Acute OT Clinical Specialist [place] Pager [phone] Office [phone]

## 2019-03-17 NOTE — TOC Transition Note (Signed)
Transition of Care [facility]) - CM/SW Discharge Note   Patient Details  Name: Brandon Brown MRN: [id number] Date of Birth: 1935/12/29  [facility] CM/SW Contact:  Pollie Friar, RN Phone Number: 03/17/2019, 2:05 PM   Clinical Narrative:    Pt discharging home. Has PCP, insurance and transportation home.   Final next level of care: Home/Self Care Barriers to Discharge: No Barriers Identified   Patient Goals and CMS Choice        Discharge Placement                       Discharge Plan and Services                          Social Determinants of Health (SDOH) Interventions     Readmission Risk Interventions No flowsheet data found.

## 2019-03-17 NOTE — Progress Notes (Signed)
STROKE TEAM PROGRESS NOTE   INTERVAL HISTORY No family is at the bedside.  Overall his condition is completely resolved. No focal deficit on exam. He has difficulty with lipitor in the past causing him generalized weakness and muscle pain with hallucinations. After discussion with him about another statin vs. Zetia, he would like to try low potency statin first.   Vitals:   03/16/19 1954 03/16/19 2310 03/17/19 0511 03/17/19 0758  BP: (!) 167/94 120/82 136/81 (!) 142/74  Pulse: 69 67 66 60  Resp: 18 18 18 18   Temp: 98.1 F (36.7 C) 99 F (37.2 C) 98.9 F (37.2 C) 98 F (36.7 C)  TempSrc: Oral Oral Oral Oral  SpO2: 95% 94% 96% 96%  Weight:      Height:        CBC:  Recent Labs  Lab 03/15/19 2317 03/17/19 0423  WBC 5.9 4.4  NEUTROABS 3.0  --   HGB 14.6 14.1  HCT 42.7 40.0  MCV 98.6 96.2  PLT 197 412    Basic Metabolic Panel:  Recent Labs  Lab 03/15/19 2317 03/17/19 0423  NA 136 136  K 4.1 3.8  CL 101 103  CO2 26 23  GLUCOSE 108* 112*  BUN 20 14  CREATININE 0.86 0.86  CALCIUM 9.2 9.0   Lipid Panel:     Component Value Date/Time   CHOL 286 (H) 03/17/2019 0423   TRIG 262 (H) 03/17/2019 0423   HDL 41 03/17/2019 0423   CHOLHDL 7.0 03/17/2019 0423   VLDL 52 (H) 03/17/2019 0423   LDLCALC 193 (H) 03/17/2019 0423   HgbA1c:  Lab Results  Component Value Date   HGBA1C 6.0 (H) 03/16/2019   Urine Drug Screen:     Component Value Date/Time   LABOPIA NONE DETECTED 03/15/2019 2334   COCAINSCRNUR NONE DETECTED 03/15/2019 2334   LABBENZ NONE DETECTED 03/15/2019 2334   AMPHETMU NONE DETECTED 03/15/2019 2334   THCU NONE DETECTED 03/15/2019 2334   LABBARB NONE DETECTED 03/15/2019 2334    Alcohol Level No results found for: ETH  IMAGING Ct Angio Head W Or Wo Contrast  Result Date: 03/16/2019 CLINICAL DATA:  Follow-up acute right MCA territory infarct. EXAM: CT ANGIOGRAPHY HEAD AND NECK TECHNIQUE: Multidetector CT imaging of the head and neck was performed using  the standard protocol during bolus administration of intravenous contrast. Multiplanar CT image reconstructions and MIPs were obtained to evaluate the vascular anatomy. Carotid stenosis measurements (when applicable) are obtained utilizing NASCET criteria, using the distal internal carotid diameter as the denominator. CONTRAST:  93mL OMNIPAQUE IOHEXOL 350 MG/ML SOLN COMPARISON:  CT head without contrast 03/16/19 FINDINGS: CTA NECK FINDINGS Aortic arch: Atherosclerotic calcifications are present within the aortic arch and at the origins the great vessels without focal aneurysm or stenosis. Right carotid system: The right common carotid artery demonstrates change without focal stenosis. There are dense calcifications at the proximal right ICA. There is no significant stenosis of greater than 50% relative to the more distal vessel. Left carotid system: Atherosclerotic calcifications are present within the distal left common carotid artery and through the bifurcation without a significant stenosis relative to the more distal vessel. Vertebral arteries: There is a 50% stenosis of the right vertebral artery at its origin. The left vertebral artery is hypoplastic. There is no significant stenosis of either vertebral artery in the neck. Minimal calcifications are present in the right vertebral artery at the C1 level. Skeleton: Slight anterolisthesis present at C4-5 and C5-6. There are chronic endplate change  C6-7 with fusion. Multilevel facet spurring is evident bilaterally. No focal lytic or blastic lesions are present. Other neck: Soft tissues the neck are otherwise unremarkable. No focal mucosal or submucosal lesions are present. Thyroid is within normal limits. Salivary glands are within normal limits. Upper chest: Lung apices are clear. Thoracic inlet is within normal limits. Review of the MIP images confirms the above findings CTA HEAD FINDINGS Anterior circulation: Atherosclerotic calcifications are present within the  cavernous internal carotid arteries bilaterally. There is no significant stenosis through the ICA termini. The A1 segments are within normal limits bilaterally. No anterior communicating artery is present. The left M1 segment is normal. There is a high-grade stenosis at the proximal right M1 segment with marked attenuation of distal right MCA branch vessels. Posterior circulation: There is moderate stenosis of the left V4 segment. PICA origins are visualized and normal. Vertebrobasilar junction is normal. The basilar artery is small. Both posterior cerebral arteries originate from basilar tip. There is a high-grade stenosis of the right P2 segment. There is attenuation of distal branch vessels bilaterally. Venous sinuses: The dural sinuses are patent. Right transverse sinus is dominant. Straight sinus and deep cerebral veins are intact. Cortical veins are unremarkable. Anatomic variants: None Delayed phase: Delayed images demonstrate no pathologic enhancement. Right MCA territory infarcts are noted. Review of the MIP images confirms the above findings IMPRESSION: 1. High-grade stenosis of the proximal right M1 segment, likely accounting for the infarcts. 2. Atherosclerotic changes at the carotid bifurcations bilaterally without other significant focal stenosis. 3. High-grade stenosis of the right P2 segment. Electronically Signed   By: San Morelle M.D.   On: 03/16/2019 21:24   Ct Head Wo Contrast  Result Date: 03/16/2019 CLINICAL DATA:  Hypertension. Confusion. EXAM: CT HEAD WITHOUT CONTRAST TECHNIQUE: Contiguous axial images were obtained from the base of the skull through the vertex without intravenous contrast. COMPARISON:  None. FINDINGS: Brain: No evidence of intracranial hemorrhage, hydrocephalus, extra-axial collection or mass lesion/mass effect. There is a small area of hypoattenuation in the cortical and subcortical matter of the right parietal lobe. Moderate brain parenchymal volume loss and  deep white matter microangiopathy. Vascular: Calcific atherosclerotic disease. Skull: Normal. Negative for fracture or focal lesion. Sinuses/Orbits: No acute finding. Other: None. IMPRESSION: 1. Small area of hypoattenuation in the cortical and subcortical matter of the right parietal lobe. This likely represents an age-indeterminate ischemic infarct. 2. No evidence of intracranial hemorrhage. Electronically Signed   By: Fidela Salisbury M.D.   On: 03/16/2019 00:52   Ct Angio Neck W Or Wo Contrast  Result Date: 03/16/2019 CLINICAL DATA:  Follow-up acute right MCA territory infarct. EXAM: CT ANGIOGRAPHY HEAD AND NECK TECHNIQUE: Multidetector CT imaging of the head and neck was performed using the standard protocol during bolus administration of intravenous contrast. Multiplanar CT image reconstructions and MIPs were obtained to evaluate the vascular anatomy. Carotid stenosis measurements (when applicable) are obtained utilizing NASCET criteria, using the distal internal carotid diameter as the denominator. CONTRAST:  59mL OMNIPAQUE IOHEXOL 350 MG/ML SOLN COMPARISON:  CT head without contrast 03/16/19 FINDINGS: CTA NECK FINDINGS Aortic arch: Atherosclerotic calcifications are present within the aortic arch and at the origins the great vessels without focal aneurysm or stenosis. Right carotid system: The right common carotid artery demonstrates change without focal stenosis. There are dense calcifications at the proximal right ICA. There is no significant stenosis of greater than 50% relative to the more distal vessel. Left carotid system: Atherosclerotic calcifications are present within the distal left  common carotid artery and through the bifurcation without a significant stenosis relative to the more distal vessel. Vertebral arteries: There is a 50% stenosis of the right vertebral artery at its origin. The left vertebral artery is hypoplastic. There is no significant stenosis of either vertebral artery in the  neck. Minimal calcifications are present in the right vertebral artery at the C1 level. Skeleton: Slight anterolisthesis present at C4-5 and C5-6. There are chronic endplate change W1-1 with fusion. Multilevel facet spurring is evident bilaterally. No focal lytic or blastic lesions are present. Other neck: Soft tissues the neck are otherwise unremarkable. No focal mucosal or submucosal lesions are present. Thyroid is within normal limits. Salivary glands are within normal limits. Upper chest: Lung apices are clear. Thoracic inlet is within normal limits. Review of the MIP images confirms the above findings CTA HEAD FINDINGS Anterior circulation: Atherosclerotic calcifications are present within the cavernous internal carotid arteries bilaterally. There is no significant stenosis through the ICA termini. The A1 segments are within normal limits bilaterally. No anterior communicating artery is present. The left M1 segment is normal. There is a high-grade stenosis at the proximal right M1 segment with marked attenuation of distal right MCA branch vessels. Posterior circulation: There is moderate stenosis of the left V4 segment. PICA origins are visualized and normal. Vertebrobasilar junction is normal. The basilar artery is small. Both posterior cerebral arteries originate from basilar tip. There is a high-grade stenosis of the right P2 segment. There is attenuation of distal branch vessels bilaterally. Venous sinuses: The dural sinuses are patent. Right transverse sinus is dominant. Straight sinus and deep cerebral veins are intact. Cortical veins are unremarkable. Anatomic variants: None Delayed phase: Delayed images demonstrate no pathologic enhancement. Right MCA territory infarcts are noted. Review of the MIP images confirms the above findings IMPRESSION: 1. High-grade stenosis of the proximal right M1 segment, likely accounting for the infarcts. 2. Atherosclerotic changes at the carotid bifurcations bilaterally  without other significant focal stenosis. 3. High-grade stenosis of the right P2 segment. Electronically Signed   By: San Morelle M.D.   On: 03/16/2019 21:24   Mr Brain Wo Contrast  Result Date: 03/16/2019 CLINICAL DATA:  Stroke EXAM: MRI HEAD WITHOUT CONTRAST TECHNIQUE: Multiplanar, multiecho pulse sequences of the brain and surrounding structures were obtained without intravenous contrast. COMPARISON:  CT head 03/16/2019 FINDINGS: Brain: Acute infarct right MCA territory. Multiple small areas of restricted diffusion in the right MCA territory. Largest infarct in the right parietal lobe measures 2 cm and corresponds to the CT hypodensity. Basal ganglia preserved. No significant chronic ischemia. Negative for hemorrhage or mass. Ventricle size normal. Vascular: Normal arterial flow voids Skull and upper cervical spine: No focal bony lesion. Degenerative change at C1-2 with pannus formation. Sinuses/Orbits: Negative Other: None IMPRESSION: Acute infarct right MCA territory. Multiple small areas of acute infarct in the right frontal parietal cortex and white matter with sparing of the basal ganglia. Negative for hemorrhage. Electronically Signed   By: Franchot Gallo M.D.   On: 03/16/2019 11:14   Mr Cervical Spine Wo Contrast  Result Date: 03/16/2019 CLINICAL DATA:  Bilateral arm numbness.  Radiculopathy. EXAM: MRI CERVICAL SPINE WITHOUT CONTRAST TECHNIQUE: Multiplanar, multisequence MR imaging of the cervical spine was performed. No intravenous contrast was administered. COMPARISON:  None. FINDINGS: Alignment: Slight anterolisthesis C6-7 Vertebrae: Negative for fracture or mass. Hemangioma C6 vertebral body. Cord: Normal signal morphology of the spinal cord. Posterior Fossa, vertebral arteries, paraspinal tissues: Normal vertebral artery flow voids on T2. Hyperintense  signal in the left vertebral lumen on gradient echo imaging may represent slow flow. No paraspinous mass. Disc levels: C2-3: Left  foraminal narrowing due to facet degeneration C3-4: Severe left foraminal narrowing due to marked facet hypertrophy. Mild spinal stenosis C4-5: Severe left foraminal stenosis due to marked facet hypertrophy. Moderate right foraminal narrowing. Mild spinal stenosis. C5-6: Bilateral facet degeneration with mild foraminal narrowing bilaterally C6-7: Disc degeneration and spurring. Bilateral facet degeneration. Moderate left foraminal encroachment due to spurring C7-T1: Marked facet degeneration bilaterally with moderate foraminal encroachment bilaterally. IMPRESSION: No acute abnormality. Multilevel disc and facet degeneration throughout the cervical spine. Multilevel spinal and foraminal stenosis as detailed above. Severe left foraminal encroachment C3-4 and C4-5 and moderate left foraminal encroachment at C6-7 due to spurring. Small left vertebral artery with increased signal on gradient echo imaging suggesting slow flow possibly due to proximal stenosis. Electronically Signed   By: Franchot Gallo M.D.   On: 03/16/2019 11:18   Vas US Carotid (at [place] Only)  Result Date: 03/17/2019 Carotid Arterial Duplex Study Indications:  CVA. Risk Factors: Hypertension, hyperlipidemia. Performing Technologist: Abram Sander RVS  Examination Guidelines: A complete evaluation includes B-mode imaging, spectral Doppler, color Doppler, and power Doppler as needed of all accessible portions of each vessel. Bilateral testing is considered an integral part of a complete examination. Limited examinations for reoccurring indications may be performed as noted.  Right Carotid Findings: +----------+--------+--------+--------+-----------+--------+           PSV cm/sEDV cm/sStenosisDescribe   Comments +----------+--------+--------+--------+-----------+--------+ CCA Prox  90      8               homogeneous         +----------+--------+--------+--------+-----------+--------+ CCA Distal81      16               homogeneous         +----------+--------+--------+--------+-----------+--------+ ICA Prox  114     22      1-39%   homogeneous         +----------+--------+--------+--------+-----------+--------+ ICA Distal81      22                                  +----------+--------+--------+--------+-----------+--------+ ECA       100     9                                   +----------+--------+--------+--------+-----------+--------+ +----------+--------+-------+--------+-------------------+           PSV cm/sEDV cmsDescribeArm Pressure (mmHG) +----------+--------+-------+--------+-------------------+ [id number]                                         +----------+--------+-------+--------+-------------------+ +---------+--------+--+--------+--+---------+ VertebralPSV cm/s52EDV cm/s11Antegrade +---------+--------+--+--------+--+---------+  Left Carotid Findings: +----------+--------+--------+--------+-----------+--------+           PSV cm/sEDV cm/sStenosisDescribe   Comments +----------+--------+--------+--------+-----------+--------+ CCA Prox  108     14              homogeneous         +----------+--------+--------+--------+-----------+--------+ CCA Distal109     18              homogeneous         +----------+--------+--------+--------+-----------+--------+ ICA Prox  72  16      1-39%   homogeneous         +----------+--------+--------+--------+-----------+--------+ ICA Distal95      27                                  +----------+--------+--------+--------+-----------+--------+ ECA       100     7                                   +----------+--------+--------+--------+-----------+--------+ +----------+--------+--------+--------+-------------------+ SubclavianPSV cm/sEDV cm/sDescribeArm Pressure (mmHG) +----------+--------+--------+--------+-------------------+           89                                           +----------+--------+--------+--------+-------------------+ +---------+--------+--+--------+---------------+ VertebralPSV cm/s46EDV cm/sBi- directional +---------+--------+--+--------+---------------+  Summary: Right Carotid: Velocities in the right ICA are consistent with a 1-39% stenosis. Left Carotid: Velocities in the left ICA are consistent with a 1-39% stenosis. Vertebrals: Right vertebral artery demonstrates antegrade flow. Left vertebral             artery demonstrates bidirectional flow. *See table(s) above for measurements and observations.  Electronically signed by Antony Contras MD on 03/17/2019 at 1:33:35 PM.    Final    2D Echocardiogram   1. The left ventricle has normal systolic function, with an ejection fraction of 55-60%. The cavity size was normal. Left ventricular diastolic Doppler parameters are consistent with impaired relaxation. No evidence of left ventricular regional wall  motion abnormalities.  2. The right ventricle has normal systolc function. The cavity was normal. Right ventricular systolic pressure could not be assessed.  3. Left atrial size was mildly dilated.  4. The aortic valve is tricuspid Moderate thickening of the aortic valve Mild calcification of the aortic valve. Aortic valve regurgitation is trivial by color flow Doppler. no stenosis of the aortic valve.   PHYSICAL EXAM  Temp:  [98 F (36.7 C)-99 F (37.2 C)] 98 F (36.7 C) (03/23 0758) Pulse Rate:  [60-79] 60 (03/23 0758) Resp:  [16-18] 18 (03/23 0758) BP: (120-174)/(74-94) 142/74 (03/23 0758) SpO2:  [94 %-96 %] 96 % (03/23 0758)  General - Well nourished, well developed, in no apparent distress.  Ophthalmologic - fundi not visualized due to noncooperation.  Cardiovascular - Regular rate and rhythm.  Mental Status -  Level of arousal and orientation to time, place, and person were intact. Language including expression, naming, repetition, comprehension was assessed and found  intact. Attention span and concentration were normal. Fund of Knowledge was assessed and was intact.  Cranial Nerves II - XII - II - Visual field intact OU. III, IV, VI - Extraocular movements intact. V - Facial sensation intact bilaterally. VII - Facial movement intact bilaterally. VIII - Hearing & vestibular intact bilaterally. X - Palate elevates symmetrically. XI - Chin turning & shoulder shrug intact bilaterally. XII - Tongue protrusion intact.  Motor Strength - The patient's strength was normal in all extremities and pronator drift was absent.  Bulk was normal and fasciculations were absent.   Motor Tone - Muscle tone was assessed at the neck and appendages and was normal.  Reflexes - The patient's reflexes were symmetrical in all extremities and he had no pathological reflexes.  Sensory - Light touch,  temperature/pinprick were assessed and were symmetrical.    Coordination - The patient had normal movements in the hands with no ataxia or dysmetria.  Tremor was absent.  Gait and Station - deferred.   ASSESSMENT/PLAN Brandon Brown is a 83 y.o. male with history of HTN, HLD, prostate cancer s/p radiation presenting with weakness and CP. Had complaints of L arm heaviness and difficulty with speech, tingling of the L side of his mouth. MRI shows mult areas R frontal parietal cortical stroke.   Stroke:  right MCA cortical and subcortical infarcts due to right M1 high grade stenosis, large vessel athero vs. Occult AF given palpitation hx  CT head R parietal cortical and subcortical infarct.  MRI  R MCA infarct - areas R frontal parietal cortical and white matter sparing the BG  MRI cervical spine no acute abnormality. disc and facet degeneration cervical spine w/ foraminal stenosis  CTA Head and Neck  High grade R M1 stenosis, atherosclerosis at B ICA bifurcations. High grade stenosis R P2.  Left subclavian artery stenosis  Carotid Doppler  B ICA 1-39% stenosis, left VA  bi-directional flow  2D Echo EF 60-65%. LA mildly dilated. No source of embolus    Loop recorder placed  LDL 193  HgbA1c 6.0  Lovenox 40 mg sq daily for VTE prophylaxis  No antithrombotic prior to admission, now on aspirin 325 mg daily and clopidogrel 75 mg daily.  Continue DAPT X 3 months then aspirin alone given severe intracranial stenosis  Therapy recommendations:  No OT, no PT  Disposition:  Return home [place] for d/c from stroke standpoint Follow-up Stroke Clinic at [facility] in 4 weeks. Office will call with appointment date and time. Order placed.  Intracranial stenosis  CTA head and neck showed high-grade right M1 stenosis, bilateral ICA bulb atherosclerosis, right P2 high-grade stenosis, left subclavian artery moderate stenosis  Chron Doppler left VA bidirectional flow  On DAPT for 3 months and then aspirin alone  BP goal 130-150 given intracranial stenosis  Hypertension  Stable . Permissive hypertension (OK if < 220/120) but gradually normalize in 5-7 days . Long-term BP goal 01/24/1949 given intracranial stenosis  Hyperlipidemia  Home meds:  No statin  LDL 193, goal < 70  History of Lipitor intolerance, now on pravachol 40  Continue statin at discharge  Recommend lipid clinic for follow-up  Other Stroke Risk Factors  Advanced age  Other Active Problems  Hx prostate cancer, s/p radiation  Palpitations/dyspnea - loop recorder placement  Hospital day # 1  Neurology will sign off. Please call with questions. Pt will follow up with stroke clinic NP at [facility] in about 4 weeks. Thanks for the consult.  Rosalin Hawking, MD PhD Stroke Neurology 03/17/2019 4:23 PM  To contact Stroke Continuity provider, please refer to [email or web address] After hours, contact General Neurology

## 2019-03-17 NOTE — Evaluation (Addendum)
Physical Therapy Evaluation Patient Details Name: Brandon Brown MRN: [id number] DOB: 1936-05-17 Today's Date: 03/17/2019   History of Present Illness  Patient is a 83 y/o male who presents with chest pain, weakness and slurred speech. Brain MRI-multiple small areas of acute infarction in right frontoparietal cortex. Head CT- small area of hypoattenuation in cortical and subcortical matter of right parietal lobe. EKG- abnormal. PMH includes HTN, HLD, prostate ca.  Clinical Impression  Patient independent PTA, drives, mows lawn and has a daily lifting weight routine. Tolerated, pt tolerated transfers, gait training and stair training with supervision progressing to Mod I for safety. Tolerated higher level balance challenges-changes in direction, walking backwards, head turns, stepping over objects etc with only mild deviations in gait but no overt LOB. Able to recall 2/3 words for short term memory within session. Education re: BeFAST. Pt has support from spouse at home and no falls reported. Discussed workout routine for home. Does not require skilled therapy services as pt functioning close to baseline. All education completed. DIscharge from therapy.    Follow Up Recommendations No PT follow up    Equipment Recommendations  None recommended by PT    Recommendations for Other Services       Precautions / Restrictions Precautions Precautions: None Restrictions Weight Bearing Restrictions: No      Mobility  Bed Mobility Overal bed mobility: Modified Independent             General bed mobility comments: No assist needed, no dizziness.  Transfers Overall transfer level: Modified independent Equipment used: None             General transfer comment: Stood from EOB x1, no assist needed.   Ambulation/Gait Ambulation/Gait assistance: Supervision;Modified independent (Device/Increase time) Gait Distance (Feet): 350 Feet Assistive device: None Gait Pattern/deviations:  Step-through pattern   Gait velocity interpretation: 1.31 - 2.62 ft/sec, indicative of limited community ambulator General Gait Details: Slow, mostly steady gait, tolerated higher level balance challenges with mild deviations in gait but no overt LOB. HR ranged from 60-90s bpm and Sp02 >91% on RA.   Stairs Stairs: Yes Stairs assistance: Modified independent (Device/Increase time) Stair Management: One rail Right Number of Stairs: 5(x2 bouts) General stair comments: Good, safe technique.  Wheelchair Mobility    Modified Rankin (Stroke Patients Only) Modified Rankin (Stroke Patients Only) Pre-Morbid Rankin Score: Slight disability Modified Rankin: Slight disability     Balance Overall balance assessment: Needs assistance Sitting-balance support: Feet supported;No upper extremity supported Sitting balance-Leahy Scale: Good Sitting balance - Comments: Able to reach outside BoS and adjust socks without difficulty.    Standing balance support: During functional activity Standing balance-Leahy Scale: Good               High level balance activites: Direction changes;Sudden stops;Head turns;Turns High Level Balance Comments: Tolerated above with mild deviations in gait but no overt LOB. Able to perform dual tasking without changes in balance.              Pertinent Vitals/Pain Pain Assessment: No/denies pain    Home Living Family/patient expects to be discharged to:: Private residence Living Arrangements: Spouse/significant other Available Help at Discharge: Family;Available 24 hours/day Type of Home: House Home Access: Level entry     Home Layout: Two level Home Equipment: Wheelchair - manual      Prior Function Level of Independence: Independent         Comments: Drives, push mows lawn. Used to [employer] certified [employer]. Has a whole lifting routine.  Hand Dominance   Dominant Hand: Right    Extremity/Trunk Assessment   Upper Extremity Assessment Upper  Extremity Assessment: Defer to OT evaluation    Lower Extremity Assessment Lower Extremity Assessment: Overall WFL for tasks assessed(Hx of nunbness in right foot from previous surgery)       Communication   Communication: No difficulties  Cognition Arousal/Alertness: Awake/alert Behavior During Therapy: WFL for tasks assessed/performed Overall Cognitive Status: Within Functional Limits for tasks assessed                                 General Comments: Able to recall 2/3 words for STM. Able to count down from 100 by 7s with increased time. A&Ox4.      General Comments General comments (skin integrity, edema, etc.): VSS throughout    Exercises     Assessment/Plan    PT Assessment Patent does not need any further PT services  PT Problem List         PT Treatment Interventions      PT Goals (Current goals can be found in the Care Plan section)  Acute Rehab PT Goals Patient Stated Goal: to get back to lifting weights and to go home PT Goal Formulation: All assessment and education complete, DC therapy    Frequency     Barriers to discharge        Co-evaluation               AM-PAC PT "6 Clicks" Mobility  Outcome Measure Help needed turning from your back to your side while in a flat bed without using bedrails?: None Help needed moving from lying on your back to sitting on the side of a flat bed without using bedrails?: None Help needed moving to and from a bed to a chair (including a wheelchair)?: None Help needed standing up from a chair using your arms (e.g., wheelchair or bedside chair)?: None Help needed to walk in hospital room?: None Help needed climbing 3-5 steps with a railing? : A Little 6 Click Score: 23    End of Session Equipment Utilized During Treatment: Gait belt Activity Tolerance: Patient tolerated treatment well Patient left: in bed;with call bell/phone within reach Nurse Communication: Mobility status PT Visit  Diagnosis: Muscle weakness (generalized) (M62.81);Difficulty in walking, not elsewhere classified (R26.2)    Time: 2025-4270 PT Time Calculation (min) (ACUTE ONLY): 32 min   Charges:   PT Evaluation $PT Eval Low Complexity: 1 Low PT Treatments $Gait Training: 8-22 mins        Wray Kearns, PT, DPT Acute Rehabilitation Services Pager [phone] Office [facility] 03/17/2019, 9:21 AM

## 2019-03-17 NOTE — Progress Notes (Signed)
Occupational Therapy Evaluation Patient Details Name: Brandon Brown MRN: [id number] DOB: [birth year] Today's Date: 03/17/2019    History of Present Illness Patient is a 83 y/o male who presents with chest pain, weakness and slurred speech. Brain MRI-multiple small areas of acute infarction in right frontoparietal cortex. Head CT- small area of hypoattenuation in cortical and subcortical matter of right parietal lobe. EKG- abnormal. PMH includes HTN, HLD, prostate ca.   Clinical Impression   Pt appears at baseline with ADL tasks. Pt assessed with the Pill Box Test of Medication Managemenjt and Executive Level Skills. More than 3 errors is considered a Fail. Pt had 21 misplacement errors and 7 omission errors, for a total of 28 errors. Pt misinterpreted instructions and totally omitted 1 medication. Discussed results with pt and he states he has not slept for "3 days". Poor sleep can attribute to poor performance on test, however recommend direct supervision with all medication management and and financial management. If pt discovers that he has difficulty with IADL tasks, recommend he follow up with Dr. Erlinda Hong. Pt verbalized understanding and was very appreciative. Educated pt on signs/symptoms of CVA using BeFast.     Follow Up Recommendations  No OT follow up;Supervision - Intermittent    Equipment Recommendations  None recommended by OT    Recommendations for Other Services       Precautions / Restrictions Precautions Precautions: None      Mobility Bed Mobility                  Transfers Overall transfer level: Independent                    Balance Overall balance assessment: No apparent balance deficits (not formally assessed)                                         ADL either performed or assessed with clinical judgement   ADL Overall ADL's : At baseline                                             Vision Baseline  Vision/History: Wears glasses Vision Assessment?: No apparent visual deficits     Perception Perception Comments: WFL   Praxis Praxis Praxis tested?: Within functional limits    Pertinent Vitals/Pain Pain Assessment: No/denies pain     Hand Dominance Right   Extremity/Trunk Assessment Upper Extremity Assessment Upper Extremity Assessment: Overall WFL for tasks assessed   Lower Extremity Assessment Lower Extremity Assessment: Overall WFL for tasks assessed       Communication     Cognition Arousal/Alertness: Awake/alert Behavior During Therapy: WFL for tasks assessed/performed Overall Cognitive Status: Impaired/Different from baseline                                 General Comments: More difficulty with attention. Failed the Pill Box Test of Medication management and Executive Level functioning, having 21 misplacement error   General Comments       Exercises     Shoulder Instructions      Home Living Family/patient expects to be discharged to:: Private residence Living Arrangements: Spouse/significant other Available Help at Discharge: Family;Available 24 hours/day Type of Home:  House Home Access: Level entry     Home Layout: Two level Alternate Level Stairs-Number of Steps: 1 flight Alternate Level Stairs-Rails: Right Bathroom Shower/Tub: Tub/shower unit   Bathroom Toilet: Handicapped height Bathroom Accessibility: Yes How Accessible: Accessible via walker Home Equipment: Wheelchair - manual          Prior Functioning/Environment Level of Independence: Independent                 OT Problem List: Decreased safety awareness      OT Treatment/Interventions:      OT Goals(Current goals can be found in the care plan section) Acute Rehab OT Goals Patient Stated Goal: to get back to lifting weights and to go home OT Goal Formulation: All assessment and education complete, DC therapy  OT Frequency:     Barriers to D/C:             Co-evaluation              AM-PAC OT "6 Clicks" Daily Activity     Outcome Measure Help from another person eating meals?: None Help from another person taking care of personal grooming?: None Help from another person toileting, which includes using toliet, bedpan, or urinal?: None Help from another person bathing (including washing, rinsing, drying)?: None Help from another person to put on and taking off regular upper body clothing?: None Help from another person to put on and taking off regular lower body clothing?: None 6 Click Score: 24   End of Session Nurse Communication: Other (comment)(DC needs)  Activity Tolerance: Patient tolerated treatment well Patient left: in bed;with call bell/phone within reach  OT Visit Diagnosis: Other symptoms and signs involving cognitive function                Time: 1343-1402 OT Time Calculation (min): 19 min Charges:  OT General Charges $OT Visit: 1 Visit OT Evaluation $OT Eval Low Complexity: [place], OT/L   Acute OT Clinical Specialist Acute Rehabilitation Services Pager [phone] Office [phone]   [facility] 03/17/2019, 2:26 PM

## 2019-03-17 NOTE — H&P (Signed)
EP Consult Note  Reason for consult: cyrptogenic stroke  Cardiologist: new  Brandon Brown HQP:[id number] DOB: 07-Dec-1936 DOA: 03/15/2019  PCP: Townsend Roger, MD  Brief History/Interval Summary: 83 y.o.malewith medical history significant ofprostate cancer, hypertension who was doing well until Monday when he mowed his grass. He felt weak and generally unwell afterwards. The next day he felt palpitation and also very weak.  He also mentioned numbness in both of his arms at times.  Denies it currently.  There was also mention of slurred speech.  Patient was evaluated by the fire department and was found to have elevated blood pressure and glucose levels.  He was subsequently brought into the hospital by his grandson.  CT showed an age-indeterminate infarct.  Patient was hospitalized for further management.  Reason for Visit: Generalized weakness.  Abnormal CT head.  Mildly elevated troponin.  Consultants: None yet  Procedures:   Transthoracic echocardiogram 1. The left ventricle has normal systolic function, with an ejection fraction of 55-60%. The cavity size was normal. Left ventricular diastolic Doppler parameters are consistent with impaired relaxation. No evidence of left ventricular regional wall motion abnormalities. 2. The right ventricle has normal systolc function. The cavity was normal. Right ventricular systolic pressure could not be assessed. 3. Left atrial size was mildly dilated. 4. The aortic valve is tricuspid Moderate thickening of the aortic valve Mild calcification of the aortic valve. Aortic valve regurgitation is trivial by color flow Doppler. no stenosis of the aortic valve.  Antibiotics: None  Subjective/Interval History: Patient denies any weakness in any one side of his body.  No chest pain or shortness of breath this morning.   ROS: Denies any nausea or vomiting  Objective:  Vital Signs        Vitals:   03/16/19 1954 03/16/19 2310  03/17/19 0511 03/17/19 0758  BP: (!) 167/94 120/82 136/81 (!) 142/74  Pulse: 69 67 66 60  Resp: 18 18 18 18   Temp: 98.1 F (36.7 C) 99 F (37.2 C) 98.9 F (37.2 C) 98 F (36.7 C)  TempSrc: Oral Oral Oral Oral  SpO2: 95% 94% 96% 96%  Weight:      Height:        Intake/Output Summary (Last 24 hours) at 03/17/2019 0910 Last data filed at 03/16/2019 1853    Gross per 24 hour  Intake 93.17 ml  Output 900 ml  Net -806.83 ml      Filed Weights   03/15/19 2301  Weight: 89.8 kg    General appearance: Awake alert.  In no distress Resp: Clear to auscultation bilaterally.  Normal effort Cardio: S1-S2 is normal regular.  No S3-S4.  Systolic murmur appreciated over the aortic area GI: Abdomen is soft.  Nontender nondistended.  Bowel sounds are present normal.  No masses organomegaly Extremities: No edema.  Full range of motion of lower extremities. Neurologic: Alert and oriented x3.  No focal neurological deficits.    Lab Results:  Data Reviewed: I have personally reviewed following labs and imaging studies  CBC: LastLabs      Recent Labs  Lab 03/15/19 2317 03/17/19 0423  WBC 5.9 4.4  NEUTROABS 3.0  --   HGB 14.6 14.1  HCT 42.7 40.0  MCV 98.6 96.2  PLT 197 178      Basic Metabolic Panel: LastLabs      Recent Labs  Lab 03/15/19 2317 03/17/19 0423  NA 136 136  K 4.1 3.8  CL 101 103  CO2 26 23  GLUCOSE  108* 112*  BUN 20 14  CREATININE 0.86 0.86  CALCIUM 9.2 9.0      GFR: Estimated Creatinine Clearance: 77.4 mL/min (by C-G formula based on SCr of 0.86 mg/dL).  Liver Function Tests: LastLabs  Recent Labs  Lab 03/15/19 2317 03/17/19 0423  AST 28 20  ALT 25 19  ALKPHOS 70 57  BILITOT 0.6 1.1  PROT 7.5 6.1*  ALBUMIN 4.4 3.6       Coagulation Profile: LastLabs     Recent Labs  Lab 03/15/19 2317  INR 1.0      Cardiac Enzymes: LastLabs      Recent Labs  Lab 03/15/19 2317 03/16/19 1120  TROPONINI  0.05* 0.03*      CBG: LastLabs     Recent Labs  Lab 03/15/19 2313  GLUCAP 108*       Radiology Studies:  ImagingResults(Last48hours)  Ct Angio Head W Or Wo Contrast  Result Date: 03/16/2019 CLINICAL DATA:  Follow-up acute right MCA territory infarct. EXAM: CT ANGIOGRAPHY HEAD AND NECK TECHNIQUE: Multidetector CT imaging of the head and neck was performed using the standard protocol during bolus administration of intravenous contrast. Multiplanar CT image reconstructions and MIPs were obtained to evaluate the vascular anatomy. Carotid stenosis measurements (when applicable) are obtained utilizing NASCET criteria, using the distal internal carotid diameter as the denominator. CONTRAST:  41mL OMNIPAQUE IOHEXOL 350 MG/ML SOLN COMPARISON:  CT head without contrast 03/16/19 FINDINGS: CTA NECK FINDINGS Aortic arch: Atherosclerotic calcifications are present within the aortic arch and at the origins the great vessels without focal aneurysm or stenosis. Right carotid system: The right common carotid artery demonstrates change without focal stenosis. There are dense calcifications at the proximal right ICA. There is no significant stenosis of greater than 50% relative to the more distal vessel. Left carotid system: Atherosclerotic calcifications are present within the distal left common carotid artery and through the bifurcation without a significant stenosis relative to the more distal vessel. Vertebral arteries: There is a 50% stenosis of the right vertebral artery at its origin. The left vertebral artery is hypoplastic. There is no significant stenosis of either vertebral artery in the neck. Minimal calcifications are present in the right vertebral artery at the C1 level. Skeleton: Slight anterolisthesis present at C4-5 and C5-6. There are chronic endplate change B2-8 with fusion. Multilevel facet spurring is evident bilaterally. No focal lytic or blastic lesions are present. Other neck: Soft  tissues the neck are otherwise unremarkable. No focal mucosal or submucosal lesions are present. Thyroid is within normal limits. Salivary glands are within normal limits. Upper chest: Lung apices are clear. Thoracic inlet is within normal limits. Review of the MIP images confirms the above findings CTA HEAD FINDINGS Anterior circulation: Atherosclerotic calcifications are present within the cavernous internal carotid arteries bilaterally. There is no significant stenosis through the ICA termini. The A1 segments are within normal limits bilaterally. No anterior communicating artery is present. The left M1 segment is normal. There is a high-grade stenosis at the proximal right M1 segment with marked attenuation of distal right MCA branch vessels. Posterior circulation: There is moderate stenosis of the left V4 segment. PICA origins are visualized and normal. Vertebrobasilar junction is normal. The basilar artery is small. Both posterior cerebral arteries originate from basilar tip. There is a high-grade stenosis of the right P2 segment. There is attenuation of distal branch vessels bilaterally. Venous sinuses: The dural sinuses are patent. Right transverse sinus is dominant. Straight sinus and deep cerebral veins are intact. Cortical  veins are unremarkable. Anatomic variants: None Delayed phase: Delayed images demonstrate no pathologic enhancement. Right MCA territory infarcts are noted. Review of the MIP images confirms the above findings IMPRESSION: 1. High-grade stenosis of the proximal right M1 segment, likely accounting for the infarcts. 2. Atherosclerotic changes at the carotid bifurcations bilaterally without other significant focal stenosis. 3. High-grade stenosis of the right P2 segment. Electronically Signed   By: San Morelle M.D.   On: 03/16/2019 21:24   Ct Head Wo Contrast  Result Date: 03/16/2019 CLINICAL DATA:  Hypertension. Confusion. EXAM: CT HEAD WITHOUT CONTRAST TECHNIQUE: Contiguous  axial images were obtained from the base of the skull through the vertex without intravenous contrast. COMPARISON:  None. FINDINGS: Brain: No evidence of intracranial hemorrhage, hydrocephalus, extra-axial collection or mass lesion/mass effect. There is a small area of hypoattenuation in the cortical and subcortical matter of the right parietal lobe. Moderate brain parenchymal volume loss and deep white matter microangiopathy. Vascular: Calcific atherosclerotic disease. Skull: Normal. Negative for fracture or focal lesion. Sinuses/Orbits: No acute finding. Other: None. IMPRESSION: 1. Small area of hypoattenuation in the cortical and subcortical matter of the right parietal lobe. This likely represents an age-indeterminate ischemic infarct. 2. No evidence of intracranial hemorrhage. Electronically Signed   By: Fidela Salisbury M.D.   On: 03/16/2019 00:52   Ct Angio Neck W Or Wo Contrast  Result Date: 03/16/2019 CLINICAL DATA:  Follow-up acute right MCA territory infarct. EXAM: CT ANGIOGRAPHY HEAD AND NECK TECHNIQUE: Multidetector CT imaging of the head and neck was performed using the standard protocol during bolus administration of intravenous contrast. Multiplanar CT image reconstructions and MIPs were obtained to evaluate the vascular anatomy. Carotid stenosis measurements (when applicable) are obtained utilizing NASCET criteria, using the distal internal carotid diameter as the denominator. CONTRAST:  42mL OMNIPAQUE IOHEXOL 350 MG/ML SOLN COMPARISON:  CT head without contrast 03/16/19 FINDINGS: CTA NECK FINDINGS Aortic arch: Atherosclerotic calcifications are present within the aortic arch and at the origins the great vessels without focal aneurysm or stenosis. Right carotid system: The right common carotid artery demonstrates change without focal stenosis. There are dense calcifications at the proximal right ICA. There is no significant stenosis of greater than 50% relative to the more distal vessel.  Left carotid system: Atherosclerotic calcifications are present within the distal left common carotid artery and through the bifurcation without a significant stenosis relative to the more distal vessel. Vertebral arteries: There is a 50% stenosis of the right vertebral artery at its origin. The left vertebral artery is hypoplastic. There is no significant stenosis of either vertebral artery in the neck. Minimal calcifications are present in the right vertebral artery at the C1 level. Skeleton: Slight anterolisthesis present at C4-5 and C5-6. There are chronic endplate change L3-8 with fusion. Multilevel facet spurring is evident bilaterally. No focal lytic or blastic lesions are present. Other neck: Soft tissues the neck are otherwise unremarkable. No focal mucosal or submucosal lesions are present. Thyroid is within normal limits. Salivary glands are within normal limits. Upper chest: Lung apices are clear. Thoracic inlet is within normal limits. Review of the MIP images confirms the above findings CTA HEAD FINDINGS Anterior circulation: Atherosclerotic calcifications are present within the cavernous internal carotid arteries bilaterally. There is no significant stenosis through the ICA termini. The A1 segments are within normal limits bilaterally. No anterior communicating artery is present. The left M1 segment is normal. There is a high-grade stenosis at the proximal right M1 segment with marked attenuation of distal right  MCA branch vessels. Posterior circulation: There is moderate stenosis of the left V4 segment. PICA origins are visualized and normal. Vertebrobasilar junction is normal. The basilar artery is small. Both posterior cerebral arteries originate from basilar tip. There is a high-grade stenosis of the right P2 segment. There is attenuation of distal branch vessels bilaterally. Venous sinuses: The dural sinuses are patent. Right transverse sinus is dominant. Straight sinus and deep cerebral veins  are intact. Cortical veins are unremarkable. Anatomic variants: None Delayed phase: Delayed images demonstrate no pathologic enhancement. Right MCA territory infarcts are noted. Review of the MIP images confirms the above findings IMPRESSION: 1. High-grade stenosis of the proximal right M1 segment, likely accounting for the infarcts. 2. Atherosclerotic changes at the carotid bifurcations bilaterally without other significant focal stenosis. 3. High-grade stenosis of the right P2 segment. Electronically Signed   By: San Morelle M.D.   On: 03/16/2019 21:24   Mr Brain Wo Contrast  Result Date: 03/16/2019 CLINICAL DATA:  Stroke EXAM: MRI HEAD WITHOUT CONTRAST TECHNIQUE: Multiplanar, multiecho pulse sequences of the brain and surrounding structures were obtained without intravenous contrast. COMPARISON:  CT head 03/16/2019 FINDINGS: Brain: Acute infarct right MCA territory. Multiple small areas of restricted diffusion in the right MCA territory. Largest infarct in the right parietal lobe measures 2 cm and corresponds to the CT hypodensity. Basal ganglia preserved. No significant chronic ischemia. Negative for hemorrhage or mass. Ventricle size normal. Vascular: Normal arterial flow voids Skull and upper cervical spine: No focal bony lesion. Degenerative change at C1-2 with pannus formation. Sinuses/Orbits: Negative Other: None IMPRESSION: Acute infarct right MCA territory. Multiple small areas of acute infarct in the right frontal parietal cortex and white matter with sparing of the basal ganglia. Negative for hemorrhage. Electronically Signed   By: Franchot Gallo M.D.   On: 03/16/2019 11:14   Mr Cervical Spine Wo Contrast  Result Date: 03/16/2019 CLINICAL DATA:  Bilateral arm numbness.  Radiculopathy. EXAM: MRI CERVICAL SPINE WITHOUT CONTRAST TECHNIQUE: Multiplanar, multisequence MR imaging of the cervical spine was performed. No intravenous contrast was administered. COMPARISON:  None. FINDINGS:  Alignment: Slight anterolisthesis C6-7 Vertebrae: Negative for fracture or mass. Hemangioma C6 vertebral body. Cord: Normal signal morphology of the spinal cord. Posterior Fossa, vertebral arteries, paraspinal tissues: Normal vertebral artery flow voids on T2. Hyperintense signal in the left vertebral lumen on gradient echo imaging may represent slow flow. No paraspinous mass. Disc levels: C2-3: Left foraminal narrowing due to facet degeneration C3-4: Severe left foraminal narrowing due to marked facet hypertrophy. Mild spinal stenosis C4-5: Severe left foraminal stenosis due to marked facet hypertrophy. Moderate right foraminal narrowing. Mild spinal stenosis. C5-6: Bilateral facet degeneration with mild foraminal narrowing bilaterally C6-7: Disc degeneration and spurring. Bilateral facet degeneration. Moderate left foraminal encroachment due to spurring C7-T1: Marked facet degeneration bilaterally with moderate foraminal encroachment bilaterally. IMPRESSION: No acute abnormality. Multilevel disc and facet degeneration throughout the cervical spine. Multilevel spinal and foraminal stenosis as detailed above. Severe left foraminal encroachment C3-4 and C4-5 and moderate left foraminal encroachment at C6-7 due to spurring. Small left vertebral artery with increased signal on gradient echo imaging suggesting slow flow possibly due to proximal stenosis. Electronically Signed   By: Franchot Gallo M.D.   On: 03/16/2019 11:18   Vas US Carotid (at [place] Only)  Result Date: 03/16/2019 Carotid Arterial Duplex Study Indications:  CVA. Risk Factors: Hypertension, hyperlipidemia. Performing Technologist: Abram Sander RVS  Examination Guidelines: A complete evaluation includes B-mode imaging, spectral Doppler, color  Doppler, and power Doppler as needed of all accessible portions of each vessel. Bilateral testing is considered an integral part of a complete examination. Limited examinations for reoccurring indications  may be performed as noted.  Right Carotid Findings: +----------+--------+--------+--------+-----------+--------+           PSV cm/sEDV cm/sStenosisDescribe   Comments +----------+--------+--------+--------+-----------+--------+ CCA Prox  90      8               homogeneous         +----------+--------+--------+--------+-----------+--------+ CCA Distal81      16              homogeneous         +----------+--------+--------+--------+-----------+--------+ ICA Prox  114     22      1-39%   homogeneous         +----------+--------+--------+--------+-----------+--------+ ICA Distal81      22                                  +----------+--------+--------+--------+-----------+--------+ ECA       100     9                                   +----------+--------+--------+--------+-----------+--------+ +----------+--------+-------+--------+-------------------+           PSV cm/sEDV cmsDescribeArm Pressure (mmHG) +----------+--------+-------+--------+-------------------+ [id number]                                         +----------+--------+-------+--------+-------------------+ +---------+--------+--+--------+--+---------+ VertebralPSV cm/s52EDV cm/s11Antegrade +---------+--------+--+--------+--+---------+  Left Carotid Findings: +----------+--------+--------+--------+-----------+--------+           PSV cm/sEDV cm/sStenosisDescribe   Comments +----------+--------+--------+--------+-----------+--------+ CCA Prox  108     14              homogeneous         +----------+--------+--------+--------+-----------+--------+ CCA Distal109     18              homogeneous         +----------+--------+--------+--------+-----------+--------+ ICA Prox  72      16      1-39%   homogeneous         +----------+--------+--------+--------+-----------+--------+ ICA Distal95      27                                   +----------+--------+--------+--------+-----------+--------+ ECA       100     7                                   +----------+--------+--------+--------+-----------+--------+ +----------+--------+--------+--------+-------------------+ SubclavianPSV cm/sEDV cm/sDescribeArm Pressure (mmHG) +----------+--------+--------+--------+-------------------+           89                                          +----------+--------+--------+--------+-------------------+ +---------+--------+--+--------+---------------+ VertebralPSV cm/s46EDV cm/sBi- directional +---------+--------+--+--------+---------------+  Summary: Right Carotid: Velocities in the right ICA are consistent with a 1-39% stenosis. Left Carotid: Velocities in the left ICA are consistent with a 1-39% stenosis.  Vertebrals: Right vertebral artery demonstrates antegrade flow. Left vertebral             artery demonstrates bidirectional flow. *See table(s) above for measurements and observations.     Preliminary       Medications:  Scheduled: . aspirin  81 mg Oral Daily  . clopidogrel  75 mg Oral Daily  . enoxaparin (LOVENOX) injection  40 mg Subcutaneous Q24H  . lisinopril  40 mg Oral Daily   Continuous: XBM:[id number], ondansetron (ZOFRAN) IV    Assessment/Plan:  Acute stroke Patient did have transient slurred speech and numbness in his arms.  He denies any neck pain but has had injuries when he used to play sports in his younger days.  He did have some neck issues then.  Bilateral arm numbness could also be cervical in origin.  MRI did show acute stroke in the right MCA territory.  MRI cervical spine did not show any acute changes however did show multilevel spinal and foraminal stenosis.  HbA1c 6.0.  LDL 193.  HDL 41.  Triglyceride 262.  Patient reports having had intolerances to multiple statins previously.  Could consider Zetia as an option.  Patient tells me that he is never tried that medication  previously.  PT OT evaluation is pending.  Cleared by speech therapy.  CT angiogram showed high-grade stenosis of the right M1 and right P2.  No significant carotid artery disease.  Await further input from stroke team.  Currently on aspirin and Plavix.  Palpitations/dyspnea It does not appear that patient had any significant chest discomfort.  He did have mildly elevated troponin at 0.05 which was subsequently 0.03.  EKG did show nonspecific changes in the form of T wave inversion especially in the inferior leads.  No concerning ST segment changes.  He was initially started on IV heparin.  Echocardiogram does not show any wall motion abnormalities.  Since patient did not have any chest pain his heparin was discontinued.  He will benefit from further cardiac work-up but this can be done in the outpatient setting in the next few weeks.  TSH normal.  Continue aspirin for now. If his ILR demonstrates atrial fib then he will be placed on systemic anti-coagulation.  History of essential hypertension Patient takes lisinopril and amlodipine.  Continue to monitor blood pressures closely.  History of prostate cancer Followed by urology.  Not noted to be on any medications for the same.  DVT Prophylaxis: Lovenox Code Status: Full code Family Communication: Discussed with the patient Disposition Plan: Management as outlined above.  Await further stroke team input.  Waiting to be seen by PT and OT.  EP Attending  Patient seen and examined. The patient has experienced a cryptogenic stroke. I have reviewed the indications/risks/benefits/goals/expectations of ILR insertion and he wishes to proceed.  Mikle Bosworth.D.

## 2019-03-18 ENCOUNTER — Encounter (HOSPITAL_COMMUNITY): Payer: Self-pay | Admitting: Internal Medicine

## 2019-03-19 ENCOUNTER — Telehealth: Payer: Self-pay

## 2019-03-19 ENCOUNTER — Encounter: Payer: Self-pay | Admitting: Internal Medicine

## 2019-03-19 NOTE — Progress Notes (Signed)
Received triage alerts due to 10 "AF" episodes recorded by [facility] on day of implant. Longest 8 mins. EGMs appear SR w/frequent ectopy, one 4 beat run of WCT is noted. See example ECGs below. Will plan to reprogram AF detection to "less sensitive" and reprogram AF episode recording to episodes >=61min at wound check on 04/14/19 if reprogramming approved by Dr. Lovena Le.

## 2019-03-19 NOTE — Telephone Encounter (Signed)
Wound check appointment notes updated with reprogramming recommendations from Dr. Lovena Le.

## 2019-03-19 NOTE — Progress Notes (Signed)
Per Dr. Lovena Le, plan for [facility] reprogramming at upcoming wound check to reduce detection of false "AF" episodes. Appointment notes updated.

## 2019-03-24 ENCOUNTER — Telehealth: Payer: Self-pay | Admitting: Cardiology

## 2019-03-24 NOTE — Telephone Encounter (Signed)
Questions answered  Chanetta Marshall, NP 03/24/2019 1:46 PM

## 2019-03-24 NOTE — Telephone Encounter (Signed)
Received a staff message in paceart that requested that pt be called and a manual transmission be sent.  Called pt and requested that he send a manual transmission w/ his home monitor. Instructed him how to do this.   Patient also has several questions about exercise, and post procedure care. Informed him that I would have nurse call him back regarding this questions. Pt verbalized understanding.

## 2019-04-07 ENCOUNTER — Telehealth: Payer: Self-pay | Admitting: Cardiology

## 2019-04-07 NOTE — Telephone Encounter (Signed)
Manual transmission reviewed. All available "AF" episode ECGs show SR w/PACs, PVCs, and artifact. No true AF noted. Continue to montior.

## 2019-04-07 NOTE — Telephone Encounter (Signed)
Spoke w/ pt and requested that he send a manual transmission w/ his home monitor. Instructed hm how to do this. Transmission received.

## 2019-04-11 ENCOUNTER — Telehealth: Payer: Self-pay | Admitting: Internal Medicine

## 2019-04-11 NOTE — Telephone Encounter (Signed)
Patient's wife called again wanting to know what she needs to do for Monday's appt. Please call her back.

## 2019-04-11 NOTE — Telephone Encounter (Signed)
New Message   Patient would like a nurse to call them about their appointment for 4/20

## 2019-04-12 NOTE — Telephone Encounter (Signed)
Spoke with wife - device needs reprogramming - they will come to the office  Chanetta Marshall, NP 04/12/2019 5:11 PM

## 2019-04-14 ENCOUNTER — Ambulatory Visit (INDEPENDENT_AMBULATORY_CARE_PROVIDER_SITE_OTHER): Payer: Medicare Other | Admitting: Student

## 2019-04-14 ENCOUNTER — Other Ambulatory Visit: Payer: Self-pay

## 2019-04-14 DIAGNOSIS — I639 Cerebral infarction, unspecified: Secondary | ICD-10-CM

## 2019-04-14 LAB — CUP PACEART INCLINIC DEVICE CHECK
Date Time Interrogation Session: 20200420110621
Implantable Pulse Generator Implant Date: 20200323

## 2019-04-14 NOTE — Progress Notes (Signed)
Loop check in clinic. Incision well healed. Battery status: Good. R-waves 0.41 mV. No symptom episodes, No tachy episodes, No pause episodes, No brady episodes. 56 AF episodes noted and reviewed; All false. Episodes reviewed. PVCs with undersensing noted. Re-programmed AF detection to "less sensitive"; AF episode storage to > 6 mins; and increased sensitivity to 0.025 mV. Monthly summary reports and ROV with GT prn.   Legrand Como [address]" [place], PA-C 04/14/2019 11:11 AM

## 2019-04-17 ENCOUNTER — Telehealth: Payer: Self-pay | Admitting: *Deleted

## 2019-04-17 NOTE — Telephone Encounter (Signed)
At wound check on 4/20, pt had asked about taking Curamin (tumeric supplement). He took it a few years ago and felt like it helped with his joints. Advised patient today that supplements containing tumeric may increase bleeding risk. Encouraged patient to check with his doctor prior to resuming this supplement. He verbalizes understanding and thanked me for my call.

## 2019-04-21 ENCOUNTER — Other Ambulatory Visit: Payer: Self-pay

## 2019-04-21 ENCOUNTER — Ambulatory Visit (INDEPENDENT_AMBULATORY_CARE_PROVIDER_SITE_OTHER): Payer: Medicare Other | Admitting: *Deleted

## 2019-04-21 DIAGNOSIS — I639 Cerebral infarction, unspecified: Secondary | ICD-10-CM | POA: Diagnosis not present

## 2019-04-21 LAB — CUP PACEART REMOTE DEVICE CHECK
Date Time Interrogation Session: 20200425143809
Implantable Pulse Generator Implant Date: 20200323

## 2019-04-22 ENCOUNTER — Telehealth: Payer: Self-pay | Admitting: *Deleted

## 2019-04-22 NOTE — Telephone Encounter (Addendum)
Called spoke to pt and wife.  Traveling in car.  Relayed having to r/s appt 05-02-19 to another date.  Made 04-28-19 at 1515 for stroke f/u.  Call pt  # [phone] (mobile) for TELEPHONE VISIT.  Did not have computer, smartphone.  Pt consented Due to current COVID 19 pandemic, our office is severely reducing in office visits for at least the next 2 weeks, in order to minimize the risk to our patients and healthcare providers.  Pt understands that although there may be some limitations with this type of visit, we will take all precautions to reduce any security or privacy concerns.  Pt understands that this will be treated like an in office visit and we will file with pt's insurance. Will call back to update chart.

## 2019-04-24 NOTE — Telephone Encounter (Signed)
As this is a hospital follow-up and new patient appointment, he should be seen via video visit and not telephone visit.  Please call patient to see if he can use any family members, friends or neighbors devices in order to undergo this visit.  If he continues to refuse virtual visit or does not have access, please reschedule visit for when he can be seen in office.

## 2019-04-24 NOTE — Telephone Encounter (Signed)
I called pt that provider wants to know if his phone has a camera. Pt stated he does not email or text message. He just turns the phone on and call people. I stated per provider a video visit is necessary because he is a new patient. I r/s patient out till July 2020. Pt given new appt and told to check in 30 minutes prior if COVID 19 is better. He verbalized understanding.

## 2019-04-28 ENCOUNTER — Inpatient Hospital Stay: Payer: Self-pay | Admitting: Adult Health

## 2019-04-29 NOTE — Progress Notes (Signed)
Carelink Summary Report / Loop Recorder 

## 2019-05-02 ENCOUNTER — Inpatient Hospital Stay: Payer: Medicare Other | Admitting: Adult Health

## 2019-05-22 ENCOUNTER — Ambulatory Visit (INDEPENDENT_AMBULATORY_CARE_PROVIDER_SITE_OTHER): Payer: Medicare Other | Admitting: *Deleted

## 2019-05-22 DIAGNOSIS — I639 Cerebral infarction, unspecified: Secondary | ICD-10-CM | POA: Diagnosis not present

## 2019-05-22 LAB — CUP PACEART REMOTE DEVICE CHECK
Date Time Interrogation Session: 20200528153946
Implantable Pulse Generator Implant Date: 20200323

## 2019-05-29 NOTE — Progress Notes (Signed)
Carelink Summary Report / Loop Recorder 

## 2019-06-24 ENCOUNTER — Ambulatory Visit (INDEPENDENT_AMBULATORY_CARE_PROVIDER_SITE_OTHER): Payer: Medicare Other | Admitting: *Deleted

## 2019-06-24 DIAGNOSIS — I639 Cerebral infarction, unspecified: Secondary | ICD-10-CM | POA: Diagnosis not present

## 2019-06-24 LAB — CUP PACEART REMOTE DEVICE CHECK
Date Time Interrogation Session: 20200630161020
Implantable Pulse Generator Implant Date: 20200323

## 2019-07-05 NOTE — Progress Notes (Signed)
Carelink Summary Report / Loop Recorder 

## 2019-07-15 ENCOUNTER — Ambulatory Visit (INDEPENDENT_AMBULATORY_CARE_PROVIDER_SITE_OTHER): Payer: Medicare Other | Admitting: Adult Health

## 2019-07-15 ENCOUNTER — Encounter: Payer: Self-pay | Admitting: Adult Health

## 2019-07-15 ENCOUNTER — Other Ambulatory Visit: Payer: Self-pay

## 2019-07-15 VITALS — BP 130/74 | HR 74 | Temp 98.2°F | Ht 73.0 in | Wt 187.0 lb

## 2019-07-15 DIAGNOSIS — I6523 Occlusion and stenosis of bilateral carotid arteries: Secondary | ICD-10-CM

## 2019-07-15 DIAGNOSIS — E785 Hyperlipidemia, unspecified: Secondary | ICD-10-CM

## 2019-07-15 DIAGNOSIS — Z95818 Presence of other cardiac implants and grafts: Secondary | ICD-10-CM

## 2019-07-15 DIAGNOSIS — I10 Essential (primary) hypertension: Secondary | ICD-10-CM

## 2019-07-15 DIAGNOSIS — I63511 Cerebral infarction due to unspecified occlusion or stenosis of right middle cerebral artery: Secondary | ICD-10-CM | POA: Diagnosis not present

## 2019-07-15 NOTE — Progress Notes (Signed)
[facility] [address] [place]. [place] [address] [phone]       HOSPITAL FOLLOW UP NOTE  [name] Date of Birth:  05/08/1936 Medical Record Number:  [id number]   Reason for Referral:  hospital stroke follow up    CHIEF COMPLAINT:  Chief Complaint  Patient presents with   Hospitalization Follow-up    Stroke f/u. Alone. Treatment room. Patient mentioned that he has been some issues with tasting his food. Patient mentioned that he has gone back to working out. He stated that he did have a fall 4-5 weeks ago, he mentioned that he has had some pain to his left leg.     HPI: Brandon Brown being seen today for in office hospital follow-up regarding right MCA cortical and subcortical infarcts on 03/16/2019.  History obtained from patient and chart review. Reviewed all radiology images and labs personally.  [name] is a 83 y.o. male with history of HTN, HLD, prostate cancer s/p radiation who presented to [facility] ED on 03/16/2019 with complaints of L arm heaviness and difficulty with speech, tingling of the L side of his mouth.  Stroke work-up revealed right MCA cortical and subcortical infarct due to right M1 high-grade stenosis with large vessel arthrosclerosis versus occult A. fib given palpitation history.  Stroke work-up completed with imaging personally reviewed.  MRI showed right MCA infarct with areas in the right frontal parietal cortical and white matter sparing the BG.  CTA head and neck showed high-grade right M1 stenosis, atherosclerosis of bilateral ICA bifurcations, high-grade stenosis right P2 and left subclavian artery stenosis.  Carotid Doppler showed bilateral ICA 1 to 39% stenosis and left VA bidirectional flow.  2D echo unremarkable.  Loop recorder placed for further evaluation of potential atrial fibrillation.  LDL 193 and A1c 6.0.  Prior history of statin intolerance and after discussion with patient, initiated pravastatin 40 mg daily  but if unable to tolerate to trial Zetia versus PSC K9 inhibitor.  Recommended BP goal 1 30-1 50 range due to intracranial stenosis. Recommended DAPT for 3 months and aspirin alone given severe intracranial stenosis.  Other stroke risk factors include advanced age but no prior history of stroke.  All symptoms resolved and discharged home in stable condition.   Residual deficit of decreased taste with mild improvement but denies any other residual deficit He has returned to all prior activities without difficulty Completed DAPT and continues on aspirin alone -initially continued on aspirin 325 mg daily but PCP reduced to 81mg  due to complaints of increased bruising and increase burning sensation while voiding which has diminished with decreasing dose. He does continue to follow with urology due to prior hx of prostate cancer. Continues on pravastatin without myalgias -he does endorse joint pain with underlying history of arthritis.  Per patient, has had repeat lipid panel since discharge and was satisfactory but unable to verify results Blood pressure satisfactory at 130/74 Loop recorder is not shown atrial fibrillation thus far He continues to stay active with working out daily imitating a healthy diet Denies new or worsening stroke/TIA symptoms    ROS:   14 system review of systems performed and negative with exception of decreased taste, joint pain easy bruising, anxiety and urination problems  PMH:  Past Medical History:  Diagnosis Date   Hx of prostatitis    Hyperlipidemia    Hypertension    Prostate cancer ([place])     PSH:  Past Surgical History:  Procedure Laterality  Date   FEMUR FRACTURE SURGERY     HERNIA REPAIR     INCISION / DRAINAGE HAND / FINGER     KNEE ARTHROSCOPY     LOOP RECORDER INSERTION N/A 03/17/2019   Procedure: LOOP RECORDER INSERTION;  Surgeon: Evans Lance, MD;  Location: [place] [facility];  Service: Cardiovascular;  Laterality: N/A;   PROSTATE  BIOPSY     ROTATOR CUFF REPAIR     TRANSURETHRAL RESECTION OF PROSTATE     TRANSURETHRAL RESECTION OF PROSTATE     tunica vaginalis excision of hydrocele right      Social History:  Social History   Socioeconomic History   Marital status: Married    Spouse name: Not on file   Number of children: Not on file   Years of education: Not on file   Highest education level: Not on file  Occupational History   Not on file  Social Needs   Financial resource strain: Not on file   Food insecurity    Worry: Not on file    Inability: Not on file   Transportation needs    Medical: Not on file    Non-medical: Not on file  Tobacco Use   Smoking status: Never Smoker   Smokeless tobacco: Never Used  Substance and Sexual Activity   Alcohol use: No   Drug use: No   Sexual activity: Never  Lifestyle   Physical activity    Days per week: Not on file    Minutes per session: Not on file   Stress: Not on file  Relationships   Social connections    Talks on phone: Not on file    Gets together: Not on file    Attends religious service: Not on file    Active member of club or organization: Not on file    Attends meetings of clubs or organizations: Not on file    Relationship status: Not on file   Intimate partner violence    Fear of current or ex partner: Not on file    Emotionally abused: Not on file    Physically abused: Not on file    Forced sexual activity: Not on file  Other Topics Concern   Not on file  Social History Narrative   Not on file    Family History:  Family History  Problem Relation Age of Onset   Heart disease Mother    Non-Hodgkin's lymphoma Mother     Medications:   Current Outpatient Medications on File Prior to Visit  Medication Sig Dispense Refill   amLODipine (NORVASC) 2.5 MG tablet Take 2.5 mg by mouth daily after breakfast.     APPLE CIDER VINEGAR PO Take 30 mLs by mouth daily after breakfast.     aspirin EC 81 MG tablet  Take 81 mg by mouth daily.     GARLIC PO Take 2 capsules by mouth daily after breakfast.     lisinopril (PRINIVIL,ZESTRIL) 40 MG tablet Take 40 mg by mouth daily after breakfast.     pravastatin (PRAVACHOL) 40 MG tablet Take 1 tablet (40 mg total) by mouth daily at 6 PM. 30 tablet 2   sildenafil (VIAGRA) 100 MG tablet Take 100 mg by mouth daily as needed for erectile dysfunction.     No current facility-administered medications on file prior to visit.     Allergies:   Allergies  Allergen Reactions   Penicillins Other (See Comments)    Unknown Did it involve swelling of the face/tongue/throat,  SOB, or low BP? Unknown Did it involve sudden or severe rash/hives, skin peeling, or any reaction on the inside of your mouth or nose? Unknown Did you need to seek medical attention at a hospital or doctor's office? Unknown When did it last happen? Unknown  If all above answers are NO, may proceed with cephalosporin use.    Statins Other (See Comments)    Joints, muscles ache     Physical Exam  Vitals:   07/15/19 1051  BP: 130/74  Pulse: 74  Temp: 98.2 F (36.8 C)  TempSrc: Oral  Weight: 187 lb (84.8 kg)  Height: 6\' 1"  (1.854 m)   Body mass index is 24.67 kg/m. No exam data present  Depression screen [facility] 2/9 07/15/2019  Decreased Interest 0  Down, Depressed, Hopeless 0  PHQ - 2 Score 0     General: well developed, well nourished,  pleasant elderly Caucasian male, seated, in no evident distress Head: head normocephalic and atraumatic.   Neck: supple with no carotid or supraclavicular bruits Cardiovascular: regular rate and rhythm, no murmurs Musculoskeletal: no deformity Skin:  no rash/petichiae Vascular:  Normal pulses all extremities   Neurologic Exam Mental Status: Awake and fully alert. Oriented to place and time. Recent and remote memory intact. Attention span, concentration and fund of knowledge appropriate. Mood and affect appropriate.  Cranial Nerves:  Fundoscopic exam reveals sharp disc margins. Pupils equal, briskly reactive to light. Extraocular movements full without nystagmus. Visual fields full to confrontation. Hearing intact. Facial sensation intact. Face, tongue, palate moves normally and symmetrically.  Motor: Normal bulk and tone. Normal strength in all tested extremity muscles. Sensory.: intact to touch , pinprick , position and vibratory sensation.  Coordination: Rapid alternating movements normal in all extremities. Finger-to-nose and heel-to-shin performed accurately bilaterally. Gait and Station: Arises from chair without difficulty. Stance is normal. Gait demonstrates normal stride length and balance Reflexes: 1+ and symmetric. Toes downgoing.     NIHSS  0 Modified Rankin  1   Diagnostic Data (Labs, Imaging, Testing)   CT head R parietal cortical and subcortical infarct.  MRI  R MCA infarct - areas R frontal parietal cortical and white matter sparing the BG  MRI cervical spine no acute abnormality. disc and facet degeneration cervical spine w/ foraminal stenosis  CTA Head and Neck  High grade R M1 stenosis, atherosclerosis at B ICA bifurcations. High grade stenosis R P2.  Left subclavian artery stenosis  Carotid Doppler  B ICA 1-39% stenosis, left VA bi-directional flow  2D Echo EF 60-65%. LA mildly dilated. No source of embolus    Loop recorder placed  LDL 193  HgbA1c 6.0    ASSESSMENT: Brandon Brown is a 83 y.o. year old male here with right MCA cortical and subcortical infarcts on 03/16/2019 secondary to right M1 high-grade stenosis versus occult A. fib given palpitation history.  Loop recorder placed which has not shown atrial fibrillation thus far.  Vascular risk factors include HTN, HLD, intracranial stenosis and advanced age.  Overall stable from stroke standpoint with residual altered taste sensation.    PLAN:  1. Right MCA infarct: Continue aspirin 81 mg daily  and pravastatin for secondary stroke  prevention. Maintain strict control of hypertension with blood pressure goal 130-150, diabetes with hemoglobin A1c goal below 6.5% and cholesterol with LDL cholesterol (bad cholesterol) goal below 70 mg/dL.  I also advised the patient to eat a healthy diet with plenty of whole grains, cereals, fruits and vegetables, exercise regularly with at  least 30 minutes of continuous activity daily and maintain ideal body weight.  Continue to monitor loop recorder for atrial fibrillation. 2. HTN: Advised to continue current treatment regimen.  Today's BP satisfactory.  Advised to continue to monitor at home along with continued follow-up with PCP for management 3. HLD: Advised to continue current treatment regimen along with continued follow-up with PCP for future prescribing and monitoring of lipid panel.  4. Intracranial stenosis: Completed 3 months DAPT.  Due to potential side effects of increased aspirin dose, advised that he is okay to maintain aspirin 81 mg at this time.  Ensure BP 1 30-1 50 range to ensure adequate perfusion.  Follow-up with PCP regarding HLD management and ongoing monitoring    Follow up in 6 months or call earlier if needed   Greater than 50% of time during this 45 minute visit was spent on counseling, explanation of diagnosis of right MCA infarct, reviewing risk factor management of HTN, HLD and intracranial stenosis, planning of further management along with potential future management, and discussion with patient and family answering all questions.    Venancio Poisson, AGNP-BC  [facility] [address] [place], [place] [address]  Phone [phone] Fax [phone] Note: This document was prepared with digital dictation and possible smart phrase technology. Any transcriptional errors that result from this process are unintentional.

## 2019-07-15 NOTE — Patient Instructions (Signed)
Continue aspirin 81 mg daily  and pravastatin  for secondary stroke prevention  Continue to follow up with PCP regarding cholesterol and blood pressure management   Try CoQ10 300mg  daily for muscle pains as this can help with muscle pains associated with statin use  Try to take melatonin at sunset as this will help assist your natural circadian rhythm. If you continue to have difficulty with sleeping, follow up with your PCP  Continue to stay active and maintain a healthy diet  Continue to monitor blood pressure at home  Maintain strict control of hypertension with blood pressure goal below 130/90, diabetes with hemoglobin A1c goal below 6.5% and cholesterol with LDL cholesterol (bad cholesterol) goal below 70 mg/dL. I also advised the patient to eat a healthy diet with plenty of whole grains, cereals, fruits and vegetables, exercise regularly and maintain ideal body weight.  Followup in the future with me in 6 months or call earlier if needed       Thank you for coming to see [place] at [facility]. I hope we have been able to provide you high quality care today.  You may receive a patient satisfaction survey over the next few weeks. We would appreciate your feedback and comments so that we may continue to improve ourselves and the health of our patients.

## 2019-07-16 NOTE — Progress Notes (Signed)
I agree with the above plan 

## 2019-07-27 LAB — CUP PACEART REMOTE DEVICE CHECK
Date Time Interrogation Session: 20200802164058
Implantable Pulse Generator Implant Date: 20200323

## 2019-07-28 ENCOUNTER — Ambulatory Visit (INDEPENDENT_AMBULATORY_CARE_PROVIDER_SITE_OTHER): Payer: Medicare Other | Admitting: *Deleted

## 2019-07-28 DIAGNOSIS — I639 Cerebral infarction, unspecified: Secondary | ICD-10-CM | POA: Diagnosis not present

## 2019-08-06 NOTE — Progress Notes (Signed)
Carelink Summary Report / Loop Recorder 

## 2019-08-29 ENCOUNTER — Ambulatory Visit (INDEPENDENT_AMBULATORY_CARE_PROVIDER_SITE_OTHER): Payer: Medicare Other | Admitting: *Deleted

## 2019-08-29 DIAGNOSIS — I639 Cerebral infarction, unspecified: Secondary | ICD-10-CM | POA: Diagnosis not present

## 2019-08-29 LAB — CUP PACEART REMOTE DEVICE CHECK
Date Time Interrogation Session: 20200904173733
Implantable Pulse Generator Implant Date: 20200323

## 2019-09-10 NOTE — Progress Notes (Signed)
Carelink Summary Report / Loop Recorder 

## 2019-10-01 ENCOUNTER — Ambulatory Visit (INDEPENDENT_AMBULATORY_CARE_PROVIDER_SITE_OTHER): Payer: Medicare Other | Admitting: *Deleted

## 2019-10-01 DIAGNOSIS — I639 Cerebral infarction, unspecified: Secondary | ICD-10-CM | POA: Diagnosis not present

## 2019-10-02 LAB — CUP PACEART REMOTE DEVICE CHECK
Date Time Interrogation Session: 20201007174219
Implantable Pulse Generator Implant Date: 20200323

## 2019-10-10 NOTE — Progress Notes (Signed)
Carelink Summary Report / Loop Recorder 

## 2019-10-20 IMAGING — CT CT ANGIOGRAPHY NECK
1 of 10 series · 6 of 46 positions shown, 11 images · IV contrast (OMNI)
Comparison: CT head without contrast 03/16/19

CLINICAL DATA: Follow-up acute right MCA territory infarct.

EXAM:
CT ANGIOGRAPHY HEAD AND NECK
TECHNIQUE: Multidetector CT imaging of the head and neck was performed using
the standard protocol during bolus administration of intravenous
contrast. Multiplanar CT image reconstructions and MIPs were
obtained to evaluate the vascular anatomy. Carotid stenosis
measurements (when applicable) are obtained utilizing NASCET
criteria, using the distal internal carotid diameter as the
denominator.
CONTRAST:  75mL OMNIPAQUE IOHEXOL 350 MG/ML SOLN

[Series 5: carotid/brain 2.0 i30f 3 · axial · 0.59mm/px · z∈[-306,-18]mm · 6 of 202 slices shown, 11 images]
[im 29/202  soft-tissue]
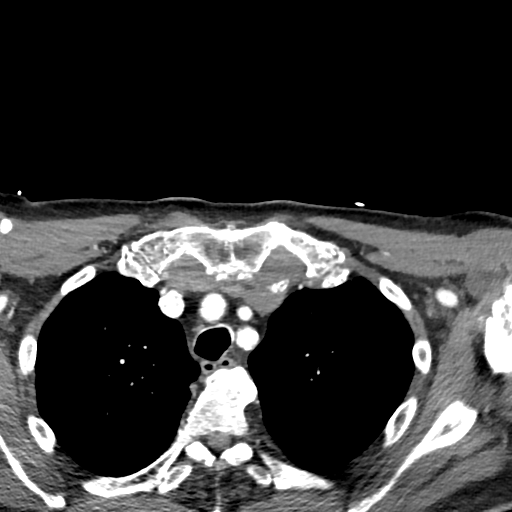
[im 29/202  bone]
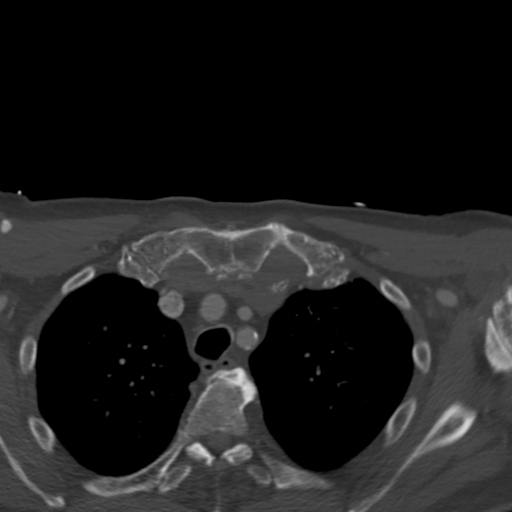
[im 58/202  soft-tissue]
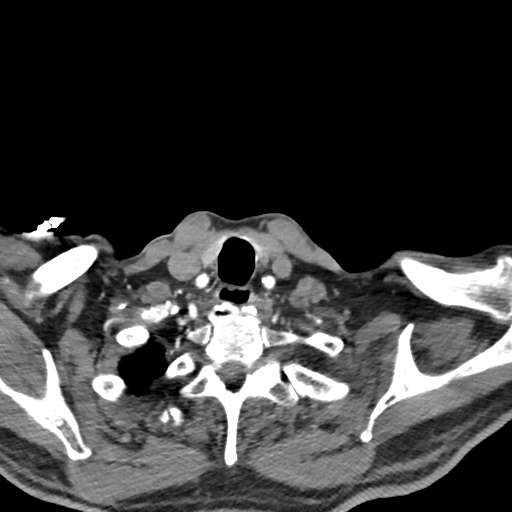
[im 87/202  soft-tissue]
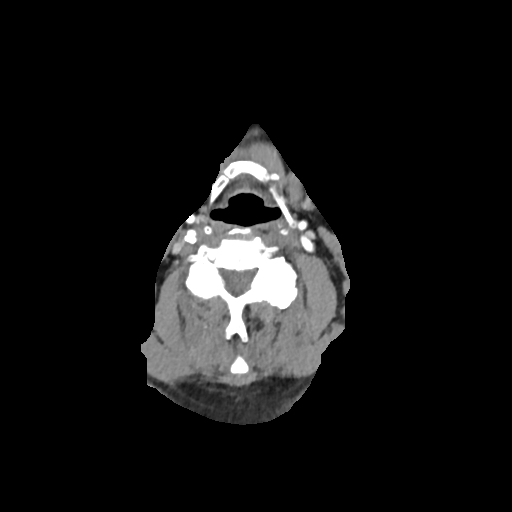
[im 87/202  lung]
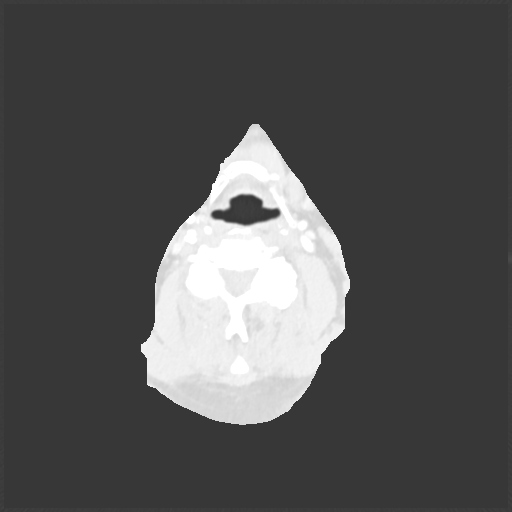
[im 115/202  soft-tissue]
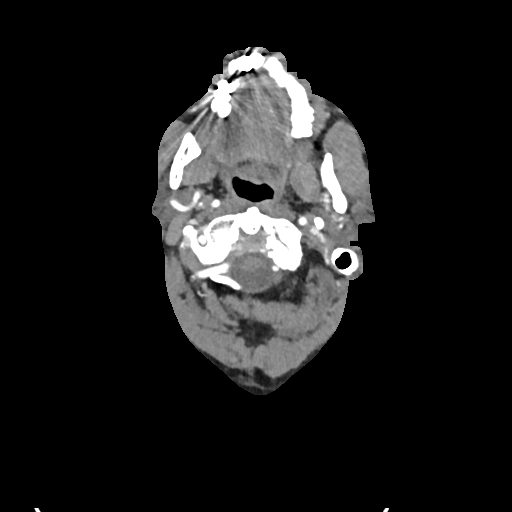
[im 115/202  lung]
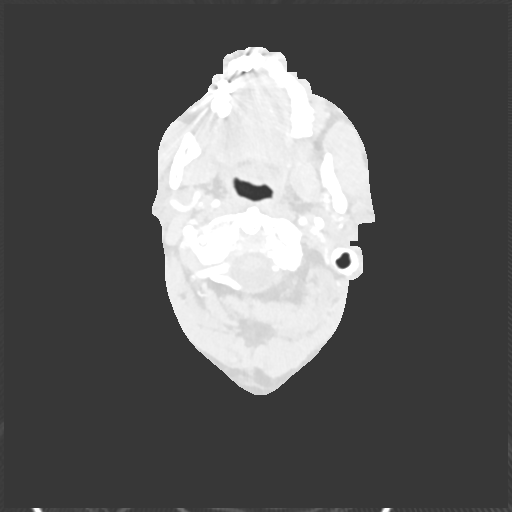
[im 144/202  soft-tissue]
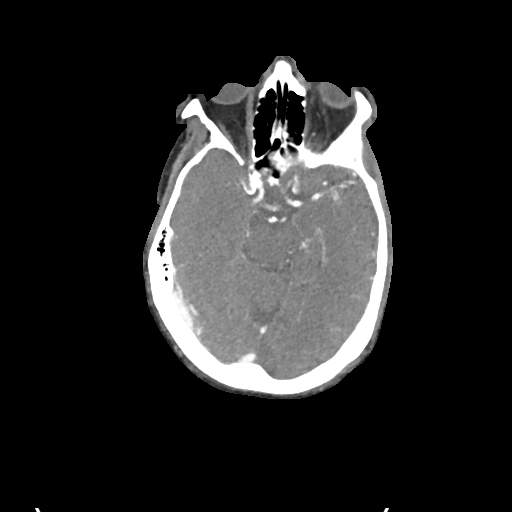
[im 144/202  lung]
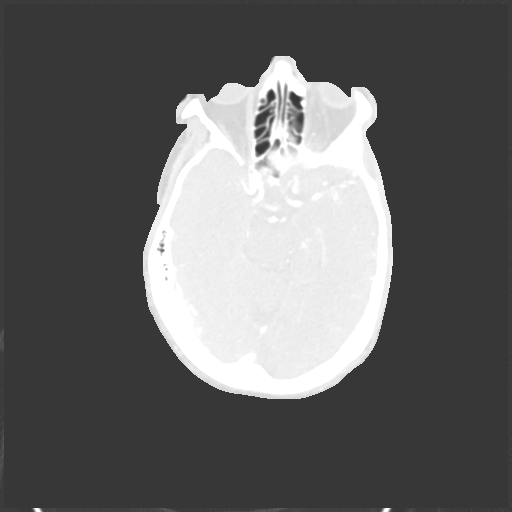
[im 173/202  soft-tissue]
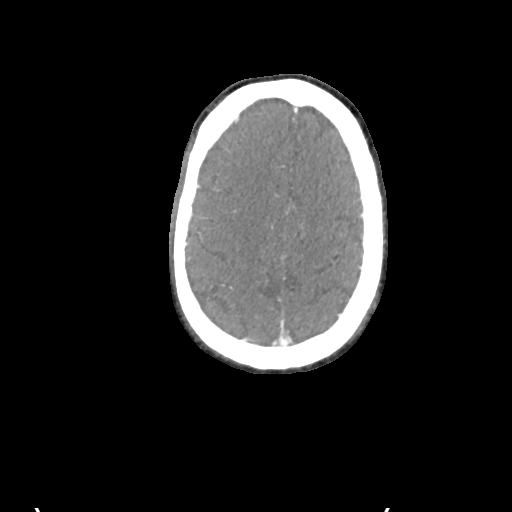
[im 173/202  lung]
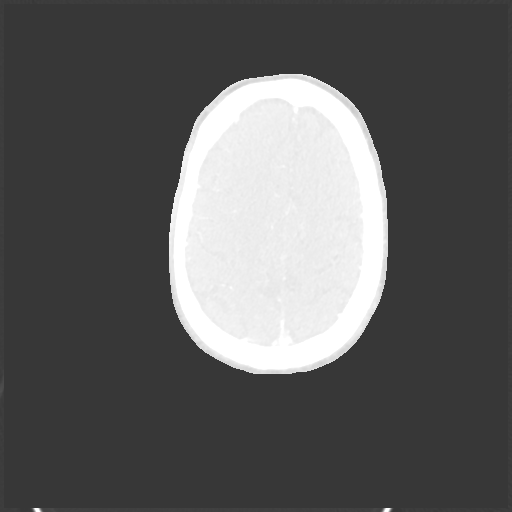

[6 of 46 positions shown; findings below may reference images not displayed]

FINDINGS: CTA NECK FINDINGS

Aortic arch: Atherosclerotic calcifications are present within the
aortic arch and at the origins the great vessels without focal
aneurysm or stenosis.

Right carotid system: The right common carotid artery demonstrates
change without focal stenosis. There are dense calcifications at the
proximal right ICA. There is no significant stenosis of greater than
50% relative to the more distal vessel.

Left carotid system: Atherosclerotic calcifications are present
within the distal left common carotid artery and through the
bifurcation without a significant stenosis relative to the more
distal vessel.

Vertebral arteries: There is a 50% stenosis of the right vertebral
artery at its origin. The left vertebral artery is hypoplastic.
There is no significant stenosis of either vertebral artery in the
neck. Minimal calcifications are present in the right vertebral
artery at the C1 level.

Skeleton: Slight anterolisthesis present at C4-5 and C5-6. There are
chronic endplate change C6-7 with fusion. Multilevel facet spurring
is evident bilaterally. No focal lytic or blastic lesions are
present.

Other neck: Soft tissues the neck are otherwise unremarkable. No
focal mucosal or submucosal lesions are present. Thyroid is within
normal limits. Salivary glands are within normal limits.

Upper chest: Lung apices are clear. Thoracic inlet is within normal
limits.

Review of the MIP images confirms the above findings

CTA HEAD FINDINGS

Anterior circulation: Atherosclerotic calcifications are present
within the cavernous internal carotid arteries bilaterally. There is
no significant stenosis through the ICA termini. The A1 segments are
within normal limits bilaterally. No anterior communicating artery
is present. The left M1 segment is normal. There is a high-grade
stenosis at the proximal right M1 segment with marked attenuation of
distal right MCA branch vessels.

Posterior circulation: There is moderate stenosis of the left V4
segment. PICA origins are visualized and normal. Vertebrobasilar
junction is normal. The basilar artery is small. Both posterior
cerebral arteries originate from basilar tip. There is a high-grade
stenosis of the right P2 segment. There is attenuation of distal
branch vessels bilaterally.

Venous sinuses: The dural sinuses are patent. Right transverse sinus
is dominant. Straight sinus and deep cerebral veins are intact.
Cortical veins are unremarkable.

Anatomic variants: None

Delayed phase: Delayed images demonstrate no pathologic enhancement.
Right MCA territory infarcts are noted.

Review of the MIP images confirms the above findings
IMPRESSION: 1. High-grade stenosis of the proximal right M1 segment, likely
accounting for the infarcts.
2. Atherosclerotic changes at the carotid bifurcations bilaterally
without other significant focal stenosis.
3. High-grade stenosis of the right P2 segment.

## 2019-11-03 ENCOUNTER — Ambulatory Visit (INDEPENDENT_AMBULATORY_CARE_PROVIDER_SITE_OTHER): Payer: Medicare Other | Admitting: *Deleted

## 2019-11-03 DIAGNOSIS — I639 Cerebral infarction, unspecified: Secondary | ICD-10-CM

## 2019-11-04 LAB — CUP PACEART REMOTE DEVICE CHECK
Date Time Interrogation Session: 20201109174426
Implantable Pulse Generator Implant Date: 20200323

## 2019-12-01 NOTE — Progress Notes (Signed)
Carelink Summary Report / Loop Recorder 

## 2019-12-05 ENCOUNTER — Ambulatory Visit (INDEPENDENT_AMBULATORY_CARE_PROVIDER_SITE_OTHER): Payer: Medicare Other | Admitting: *Deleted

## 2019-12-05 DIAGNOSIS — I639 Cerebral infarction, unspecified: Secondary | ICD-10-CM | POA: Diagnosis not present

## 2019-12-07 LAB — CUP PACEART REMOTE DEVICE CHECK
Date Time Interrogation Session: 20201212124401
Implantable Pulse Generator Implant Date: 20200323

## 2020-01-08 ENCOUNTER — Ambulatory Visit (INDEPENDENT_AMBULATORY_CARE_PROVIDER_SITE_OTHER): Payer: Medicare PPO | Admitting: *Deleted

## 2020-01-08 DIAGNOSIS — R0989 Other specified symptoms and signs involving the circulatory and respiratory systems: Secondary | ICD-10-CM | POA: Diagnosis not present

## 2020-01-08 LAB — CUP PACEART REMOTE DEVICE CHECK
Date Time Interrogation Session: 20210114125747
Implantable Pulse Generator Implant Date: 20200323

## 2020-01-09 NOTE — Progress Notes (Signed)
ILR remote 

## 2020-01-20 ENCOUNTER — Ambulatory Visit: Payer: Medicare Other | Admitting: Adult Health

## 2020-01-29 DIAGNOSIS — H5203 Hypermetropia, bilateral: Secondary | ICD-10-CM | POA: Diagnosis not present

## 2020-01-29 DIAGNOSIS — H25813 Combined forms of age-related cataract, bilateral: Secondary | ICD-10-CM | POA: Diagnosis not present

## 2020-01-29 DIAGNOSIS — H3530 Unspecified macular degeneration: Secondary | ICD-10-CM | POA: Diagnosis not present

## 2020-01-29 DIAGNOSIS — H524 Presbyopia: Secondary | ICD-10-CM | POA: Diagnosis not present

## 2020-01-29 DIAGNOSIS — H52223 Regular astigmatism, bilateral: Secondary | ICD-10-CM | POA: Diagnosis not present

## 2020-01-29 DIAGNOSIS — I10 Essential (primary) hypertension: Secondary | ICD-10-CM | POA: Diagnosis not present

## 2020-01-29 DIAGNOSIS — H35342 Macular cyst, hole, or pseudohole, left eye: Secondary | ICD-10-CM | POA: Diagnosis not present

## 2020-01-29 DIAGNOSIS — H353132 Nonexudative age-related macular degeneration, bilateral, intermediate dry stage: Secondary | ICD-10-CM | POA: Diagnosis not present

## 2020-02-09 ENCOUNTER — Ambulatory Visit (INDEPENDENT_AMBULATORY_CARE_PROVIDER_SITE_OTHER): Payer: Medicare PPO | Admitting: *Deleted

## 2020-02-09 DIAGNOSIS — R0989 Other specified symptoms and signs involving the circulatory and respiratory systems: Secondary | ICD-10-CM | POA: Diagnosis not present

## 2020-02-09 LAB — CUP PACEART REMOTE DEVICE CHECK
Date Time Interrogation Session: 20210214234933
Implantable Pulse Generator Implant Date: 20200323

## 2020-02-10 NOTE — Progress Notes (Signed)
ILR Remote 

## 2020-02-19 ENCOUNTER — Ambulatory Visit: Payer: Medicare Other | Admitting: Adult Health

## 2020-03-04 ENCOUNTER — Other Ambulatory Visit: Payer: Self-pay

## 2020-03-04 ENCOUNTER — Encounter: Payer: Self-pay | Admitting: Adult Health

## 2020-03-04 ENCOUNTER — Ambulatory Visit (INDEPENDENT_AMBULATORY_CARE_PROVIDER_SITE_OTHER): Payer: Medicare PPO | Admitting: Adult Health

## 2020-03-04 VITALS — BP 146/78 | HR 72 | Temp 97.5°F | Ht 73.0 in | Wt 189.2 lb

## 2020-03-04 DIAGNOSIS — I10 Essential (primary) hypertension: Secondary | ICD-10-CM | POA: Diagnosis not present

## 2020-03-04 DIAGNOSIS — Z95818 Presence of other cardiac implants and grafts: Secondary | ICD-10-CM | POA: Diagnosis not present

## 2020-03-04 DIAGNOSIS — I63511 Cerebral infarction due to unspecified occlusion or stenosis of right middle cerebral artery: Secondary | ICD-10-CM

## 2020-03-04 DIAGNOSIS — E785 Hyperlipidemia, unspecified: Secondary | ICD-10-CM

## 2020-03-04 NOTE — Patient Instructions (Addendum)
Continue aspirin 81 mg daily  and Crestor for secondary stroke prevention  Continue to follow up with PCP regarding cholesterol and blood pressure management   Continue to monitor loop recorder for atrial fibrillation   Continue to monitor blood pressure at home  Maintain strict control of hypertension with blood pressure goal below 130/90, diabetes with hemoglobin A1c goal below 6.5% and cholesterol with LDL cholesterol (bad cholesterol) goal below 70 mg/dL. I also advised the patient to eat a healthy diet with plenty of whole grains, cereals, fruits and vegetables, exercise regularly and maintain ideal body weight.       Thank you for coming to see [place] at [facility]. I hope we have been able to provide you high quality care today.  You may receive a patient satisfaction survey over the next few weeks. We would appreciate your feedback and comments so that we may continue to improve ourselves and the health of our patients.

## 2020-03-04 NOTE — Progress Notes (Signed)
[facility] [address] [place]. [place] [address] [phone]       STROKE FOLLOW UP NOTE  [name] Date of Birth:  [birth year] Medical Record Number:  [id number]   Reason for Referral: stroke follow up    CHIEF COMPLAINT:  Chief Complaint  Patient presents with  . Follow-up    Rm treatment, alone.  No concerns  . Cerebrovascular Accident    HPI:  Brandon Brown is an 84 year old male who is being seen today, 03/04/2020, for stroke follow-up.  He has been stable since prior visit without new or reoccurring stroke/TIA symptoms. Continues on aspirin without bleeding or bruising. Was previously on pravastatin but switched to Crestor due to myalgias - has been doing well on Crestor.  Plans on obtaining lab work tomorrow with PCP blood pressure today initially elevated and on recheck 146/78.  He does endorse whitecoat syndrome and typically stable at home.  Loop recorder is not shown atrial fibrillation thus far. He has received both his COVID injections recently.  No concerns at this time.     History provided for reference purposes only Stroke admission 03/16/2019: Brandon Brown is a 84 y.o. male with history of HTN, HLD, prostate cancer s/p radiation who presented to [facility] ED on 03/16/2019 with complaints of L arm heaviness and difficulty with speech, tingling of the L side of his mouth.  Stroke work-up revealed right MCA cortical and subcortical infarct due to right M1 high-grade stenosis with large vessel arthrosclerosis versus occult A. fib given palpitation history.  Stroke work-up completed with imaging personally reviewed.  MRI showed right MCA infarct with areas in the right frontal parietal cortical and white matter sparing the BG.  CTA head and neck showed high-grade right M1 stenosis, atherosclerosis of bilateral ICA bifurcations, high-grade stenosis right P2 and left subclavian artery stenosis.  Carotid Doppler showed bilateral ICA 1 to 39% stenosis  and left VA bidirectional flow.  2D echo unremarkable.  Loop recorder placed for further evaluation of potential atrial fibrillation.  LDL 193 and A1c 6.0.  Prior history of statin intolerance and after discussion with patient, initiated pravastatin 40 mg daily but if unable to tolerate to trial Zetia versus PSC K9 inhibitor.  Recommended BP goal 1 30-1 50 range due to intracranial stenosis. Recommended DAPT for 3 months and aspirin alone given severe intracranial stenosis.  Other stroke risk factors include advanced age but no prior history of stroke.  All symptoms resolved and discharged home in stable condition.   Initial visit 07/15/2019: Residual deficit of decreased taste with mild improvement but denies any other residual deficit He has returned to all prior activities without difficulty Completed DAPT and continues on aspirin alone -initially continued on aspirin 325 mg daily but PCP reduced to 81mg  due to complaints of increased bruising and increase burning sensation while voiding which has diminished with decreasing dose. He does continue to follow with urology due to prior hx of prostate cancer. Continues on pravastatin without myalgias -he does endorse joint pain with underlying history of arthritis.  Per patient, has had repeat lipid panel since discharge and was satisfactory but unable to verify results Blood pressure satisfactory at 130/74 Loop recorder is not shown atrial fibrillation thus far He continues to stay active with working out daily imitating a healthy diet Denies new or worsening stroke/TIA symptoms     ROS:   14 system review of systems performed and negative with exception of joint pain  PMH:  Past Medical History:  Diagnosis Date  . Hx of prostatitis   . Hyperlipidemia   . Hypertension   . Prostate cancer ([place])     PSH:  Past Surgical History:  Procedure Laterality Date  . FEMUR FRACTURE SURGERY    . HERNIA REPAIR    . INCISION / DRAINAGE HAND / FINGER     . KNEE ARTHROSCOPY    . LOOP RECORDER INSERTION N/A 03/17/2019   Procedure: LOOP RECORDER INSERTION;  Surgeon: Evans Lance, MD;  Location: [facility];  Service: Cardiovascular;  Laterality: N/A;  . PROSTATE BIOPSY    . ROTATOR CUFF REPAIR    . TRANSURETHRAL RESECTION OF PROSTATE    . TRANSURETHRAL RESECTION OF PROSTATE    . tunica vaginalis excision of hydrocele right      Social History:  Social History   Socioeconomic History  . Marital status: Married    Spouse name: Not on file  . Number of children: Not on file  . Years of education: Not on file  . Highest education level: Not on file  Occupational History  . Not on file  Tobacco Use  . Smoking status: Never Smoker  . Smokeless tobacco: Never Used  Substance and Sexual Activity  . Alcohol use: No  . Drug use: No  . Sexual activity: Never  Other Topics Concern  . Not on file  Social History Narrative  . Not on file   Social Determinants of Health   Financial Resource Strain:   . Difficulty of Paying Living Expenses:   Food Insecurity:   . Worried About Charity fundraiser in the Last Year:   . Arboriculturist in the Last Year:   Transportation Needs:   . Film/video editor (Medical):   Marland Kitchen Lack of Transportation (Non-Medical):   Physical Activity:   . Days of Exercise per Week:   . Minutes of Exercise per Session:   Stress:   . Feeling of Stress :   Social Connections:   . Frequency of Communication with Friends and Family:   . Frequency of Social Gatherings with Friends and Family:   . Attends Religious Services:   . Active Member of Clubs or Organizations:   . Attends Archivist Meetings:   Marland Kitchen Marital Status:   Intimate Partner Violence:   . Fear of Current or Ex-Partner:   . Emotionally Abused:   Marland Kitchen Physically Abused:   . Sexually Abused:     Family History:  Family History  Problem Relation Age of Onset  . Heart disease Mother   . Non-Hodgkin's lymphoma Mother      Medications:   Current Outpatient Medications on File Prior to Visit  Medication Sig Dispense Refill  . amLODipine (NORVASC) 2.5 MG tablet Take 2.5 mg by mouth daily after breakfast.    . aspirin EC 81 MG tablet Take 81 mg by mouth daily.    Marland Kitchen Co-Enzyme Q-10 100 MG CAPS Take 200 mg by mouth daily.    Marland Kitchen lisinopril (PRINIVIL,ZESTRIL) 40 MG tablet Take 40 mg by mouth daily after breakfast.    . Melatonin 10 MG CAPS Take by mouth at bedtime as needed.    . Omega-3 Fatty Acids (FISH OIL) 1000 MG CAPS Take 2,000 mg by mouth.    . Rosuvastatin Calcium 40 MG CPSP Take by mouth daily.     No current facility-administered medications on file prior to visit.    Allergies:   Allergies  Allergen  Reactions  . Penicillins Other (See Comments)    Unknown Did it involve swelling of the face/tongue/throat, SOB, or low BP? Unknown Did it involve sudden or severe rash/hives, skin peeling, or any reaction on the inside of your mouth or nose? Unknown Did you need to seek medical attention at a hospital or doctor's office? Unknown When did it last happen? Unknown  If all above answers are "NO", may proceed with cephalosporin use.   . Statins Other (See Comments)    Joints, muscles ache     Physical Exam  Vitals:   03/04/20 1418  BP: (!) 146/78  Pulse: 72  Temp: (!) 97.5 F (36.4 C)  SpO2: 94%  Weight: 189 lb 3.2 oz (85.8 kg)  Height: 6\' 1"  (1.854 m)   Body mass index is 24.96 kg/m. No exam data present  General: well developed, well nourished, pleasant elderly Caucasian male, seated, in no evident distress Head: head normocephalic and atraumatic.   Neck: supple with no carotid or supraclavicular bruits Cardiovascular: regular rate and rhythm, no murmurs Musculoskeletal: no deformity Skin:  no rash/petichiae Vascular:  Normal pulses all extremities   Neurologic Exam Mental Status: Awake and fully alert.  Normal speech and language. Oriented to place and time. Recent and remote  memory intact. Attention span, concentration and fund of knowledge appropriate. Mood and affect appropriate.  Cranial Nerves: Pupils equal, briskly reactive to light. Extraocular movements full without nystagmus. Visual fields full to confrontation. Hearing intact. Facial sensation intact. Face, tongue, palate moves normally and symmetrically.  Motor: Normal bulk and tone. Normal strength in all tested extremity muscles. Sensory.: intact to touch , pinprick , position and vibratory sensation.  Coordination: Rapid alternating movements normal in all extremities. Finger-to-nose and heel-to-shin performed accurately bilaterally. Gait and Station: Arises from chair without difficulty. Stance is normal. Gait demonstrates normal stride length and balance Reflexes: 1+ and symmetric. Toes downgoing.       Diagnostic Data (Labs, Imaging, Testing)   CT head R parietal cortical and subcortical infarct.  MRI  R MCA infarct - areas R frontal parietal cortical and white matter sparing the BG  MRI cervical spine no acute abnormality. disc and facet degeneration cervical spine w/ foraminal stenosis  CTA Head and Neck  High grade R M1 stenosis, atherosclerosis at B ICA bifurcations. High grade stenosis R P2.  Left subclavian artery stenosis  Carotid Doppler  B ICA 1-39% stenosis, left VA bi-directional flow  2D Echo EF 60-65%. LA mildly dilated. No source of embolus    Loop recorder placed  LDL 193  HgbA1c 6.0    ASSESSMENT: Brandon Brown is a 84 y.o. year old male here with right MCA cortical and subcortical infarcts on 03/16/2019 secondary to right M1 high-grade stenosis versus occult A. fib given palpitation history.  Loop recorder placed which has not shown atrial fibrillation thus far.  Vascular risk factors include HTN, HLD, intracranial stenosis and advanced age.  Recovered well from a stroke standpoint without residual deficits.    PLAN:  1. Right MCA infarct: Continue aspirin 81 mg  daily  and Crestor for secondary stroke prevention. Maintain strict control of hypertension with blood pressure goal 130-150, diabetes with hemoglobin A1c goal below 6.5% and cholesterol with LDL cholesterol (bad cholesterol) goal below 70 mg/dL.  I also advised the patient to eat a healthy diet with plenty of whole grains, cereals, fruits and vegetables, exercise regularly with at least 30 minutes of continuous activity daily and maintain ideal body weight.  Continue to monitor loop recorder for atrial fibrillation. 2. HTN: Advised to continue current treatment regimen.  Today's BP satisfactory.  Advised to continue to monitor at home along with continued follow-up with PCP for management 3. HLD: Advised to continue current treatment regimen along with continued follow-up with PCP for future prescribing and monitoring of lipid panel.  4. Intracranial stenosis: Continue aspirin and statin ongoing along with importance of managing risk factors.  No indication for repeat imaging at this time as patient is doing well and is asymptomatic therefore current treatment plan would not change   Follow-up as needed as stable from stroke standpoint   I spent 20 minutes of face-to-face and non-face-to-face time with patient.  This included previsit chart review, lab review, study review, order entry, electronic health record documentation, patient education     Frann Rider, [facility]  [facility] [address] [place], [place] [address]  Phone [phone] Fax [phone] Note: This document was prepared with digital dictation and possible smart phrase technology. Any transcriptional errors that result from this process are unintentional.

## 2020-03-05 DIAGNOSIS — E7800 Pure hypercholesterolemia, unspecified: Secondary | ICD-10-CM | POA: Diagnosis not present

## 2020-03-05 DIAGNOSIS — I10 Essential (primary) hypertension: Secondary | ICD-10-CM | POA: Diagnosis not present

## 2020-03-09 DIAGNOSIS — C61 Malignant neoplasm of prostate: Secondary | ICD-10-CM | POA: Diagnosis not present

## 2020-03-16 DIAGNOSIS — R3121 Asymptomatic microscopic hematuria: Secondary | ICD-10-CM | POA: Diagnosis not present

## 2020-03-16 DIAGNOSIS — Z8546 Personal history of malignant neoplasm of prostate: Secondary | ICD-10-CM | POA: Diagnosis not present

## 2020-03-18 ENCOUNTER — Telehealth: Payer: Self-pay

## 2020-03-18 ENCOUNTER — Telehealth: Payer: Self-pay | Admitting: Adult Health

## 2020-03-18 NOTE — Telephone Encounter (Signed)
Pt would like to speak to the RN. No other information was given. Please advise.

## 2020-03-18 NOTE — Telephone Encounter (Signed)
The pt wanted help sending a manual transmission with his home monitor. I called tech support to get additional help. Transmission received.

## 2020-03-18 NOTE — Telephone Encounter (Signed)
Attempted to call pt, VM not set up 

## 2020-03-22 NOTE — Telephone Encounter (Signed)
I called pt.  He stated already taken care of , thru Maudie Mercury, Merchant navy officer.  This had to do with heart monitor.  If he has questions this would need to go to cardiology who supplies heart monitors.

## 2020-03-29 NOTE — Progress Notes (Signed)
I agree with the above plan 

## 2020-04-11 LAB — CUP PACEART REMOTE DEVICE CHECK
Date Time Interrogation Session: 20210418014359
Implantable Pulse Generator Implant Date: 20200323

## 2020-04-12 ENCOUNTER — Ambulatory Visit (INDEPENDENT_AMBULATORY_CARE_PROVIDER_SITE_OTHER): Payer: Medicare PPO | Admitting: *Deleted

## 2020-04-12 DIAGNOSIS — R0989 Other specified symptoms and signs involving the circulatory and respiratory systems: Secondary | ICD-10-CM

## 2020-04-12 NOTE — Progress Notes (Signed)
ILR Remote 

## 2020-04-23 ENCOUNTER — Other Ambulatory Visit: Payer: Self-pay

## 2020-04-23 ENCOUNTER — Encounter: Payer: Self-pay | Admitting: Orthopaedic Surgery

## 2020-04-23 ENCOUNTER — Ambulatory Visit: Payer: Medicare PPO | Admitting: Orthopaedic Surgery

## 2020-04-23 DIAGNOSIS — R200 Anesthesia of skin: Secondary | ICD-10-CM

## 2020-04-23 DIAGNOSIS — M79631 Pain in right forearm: Secondary | ICD-10-CM

## 2020-04-23 MED ORDER — GABAPENTIN 100 MG PO CAPS: ORAL_CAPSULE | ORAL | 1 refills | Status: DC

## 2020-04-23 NOTE — Progress Notes (Signed)
Office Visit Note   Patient: Brandon Brown           Date of Birth: 1936-03-12           MRN: [id number] Visit Date: 04/23/2020              Requested by: Townsend Roger, MD [address] [place],  [place] [address] PCP: Townsend Roger, MD   Assessment & Plan: Visit Diagnoses:  1. Right forearm pain   2. Numbness in feet     Plan: We will place patient on some gabapentin low-dose see if he gets some relief.  Recheck 4 weeks.  Gabapentin 100 mg 1 at night for 4 nights then 2 capsules at night.  Follow-Up Instructions: Return in about 4 weeks (around 05/21/2020).   Orders:  No orders of the defined types were placed in this encounter.  Meds ordered this encounter  Medications  . DISCONTD: gabapentin (NEURONTIN) 100 MG capsule    Sig: Take one at night for 4 days then 2 capsules at night    Dispense:  60 capsule    Refill:  1  . DISCONTD: gabapentin (NEURONTIN) 100 MG capsule    Sig: Take one at night for 4 days then 2 capsules at night    Dispense:  60 capsule    Refill:  1  . gabapentin (NEURONTIN) 100 MG capsule    Sig: Take one at night for 4 days then 2 capsules at night    Dispense:  60 capsule    Refill:  1      Procedures: No procedures performed   Clinical Data: No additional findings.   Subjective: Chief Complaint  Patient presents with  . Right Forearm - Pain  . Right Leg - Pain  . 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  Is also had problems with burning sensation in both legs lower legs above the ankle somewhat left calf sometimes gets sore and some stinging in his feet.  He states even if he drinks 1 beer tends to escalate the burning.  He denies swelling or erythema of any joints.  No past history of gout.    Review of Systems positive for history of CVA.  Positive for  hypertension.  Positive prostate cancer otherwise negative as pertains HPI.   Objective: Vital Signs: BP 129/69   Pulse 67   Ht 6\' 1"  (1.854 m)   Wt 189 lb (85.7 kg)   BMI 24.94 kg/m   Physical Exam Constitutional:      Appearance: He is well-developed.  HENT:     Head: Normocephalic and atraumatic.  Eyes:     Pupils: Pupils are equal, round, and reactive to light.  Neck:     Thyroid: No thyromegaly.     Trachea: No tracheal deviation.  Cardiovascular:     Rate and Rhythm: Normal rate.  Pulmonary:     Effort: Pulmonary effort is normal.     Breath sounds: No wheezing.  Abdominal:     General: Bowel sounds are normal.     Palpations: Abdomen is soft.  Skin:    General: Skin is warm and dry.     Capillary Refill: Capillary refill takes less than 2 seconds.  Neurological:     Mental Status:  He is alert and oriented to person, place, and time.  Psychiatric:        Behavior: Behavior normal.        Thought Content: Thought content normal.        Judgment: Judgment normal.     Ortho Exam patient has tenderness adjacent to the mobile wad proximal third with pain with palpation of the radial tunnel.  No decreased sensation radial sensory nerve distally.  Negative Froment's sign.  No thenar atrophy.  Mild tenderness over the carpal canal.  Negative Phalen.  Knee and ankle jerk intact.  He is able to heel and toe walk.  Some tenderness over the fibular neck with palpation of the peroneal nerve.  Specialty Comments:  No specialty comments available.  Imaging: No results found.   PMFS History: Patient Active Problem List   Diagnosis Date Noted  . Right forearm pain 04/26/2020  . Numbness in feet 04/26/2020  . Chest pain 03/16/2019  . Benign essential HTN 03/16/2019  . Suspected cerebrovascular accident (CVA) 03/16/2019  . Acute CVA (cerebrovascular accident) ([place]) 03/16/2019  . Prostate cancer ([place]) 12/05/2013   Past Medical History:  Diagnosis Date  . Hx of  prostatitis   . Hyperlipidemia   . Hypertension   . Prostate cancer [facility])     Family History  Problem Relation Age of Onset  . Heart disease Mother   . Non-Hodgkin's lymphoma Mother     Past Surgical History:  Procedure Laterality Date  . FEMUR FRACTURE SURGERY    . HERNIA REPAIR    . INCISION / DRAINAGE HAND / FINGER    . KNEE ARTHROSCOPY    . LOOP RECORDER INSERTION N/A 03/17/2019   Procedure: LOOP RECORDER INSERTION;  Surgeon: Evans Lance, MD;  Location: [place] [facility];  Service: Cardiovascular;  Laterality: N/A;  . PROSTATE BIOPSY    . ROTATOR CUFF REPAIR    . TRANSURETHRAL RESECTION OF PROSTATE    . TRANSURETHRAL RESECTION OF PROSTATE    . tunica vaginalis excision of hydrocele right     Social History   Occupational History  . Not on file  Tobacco Use  . Smoking status: Never Smoker  . Smokeless tobacco: Never Used  Substance and Sexual Activity  . Alcohol use: No  . Drug use: No  . Sexual activity: Never

## 2020-04-26 DIAGNOSIS — M79631 Pain in right forearm: Secondary | ICD-10-CM | POA: Insufficient documentation

## 2020-04-26 DIAGNOSIS — R200 Anesthesia of skin: Secondary | ICD-10-CM | POA: Insufficient documentation

## 2020-05-12 LAB — CUP PACEART REMOTE DEVICE CHECK
Date Time Interrogation Session: 20210519015314
Implantable Pulse Generator Implant Date: 20200323

## 2020-05-17 ENCOUNTER — Ambulatory Visit (INDEPENDENT_AMBULATORY_CARE_PROVIDER_SITE_OTHER): Payer: Medicare PPO | Admitting: *Deleted

## 2020-05-17 DIAGNOSIS — I639 Cerebral infarction, unspecified: Secondary | ICD-10-CM | POA: Diagnosis not present

## 2020-05-17 NOTE — Progress Notes (Signed)
Carelink Summary Report / Loop Recorder 

## 2020-05-26 ENCOUNTER — Encounter: Payer: Self-pay | Admitting: Orthopaedic Surgery

## 2020-05-26 ENCOUNTER — Ambulatory Visit: Payer: Medicare PPO | Admitting: Orthopaedic Surgery

## 2020-05-26 ENCOUNTER — Other Ambulatory Visit: Payer: Self-pay

## 2020-05-26 DIAGNOSIS — M4802 Spinal stenosis, cervical region: Secondary | ICD-10-CM

## 2020-05-26 MED ORDER — GABAPENTIN 100 MG PO CAPS: ORAL_CAPSULE | ORAL | 1 refills | Status: DC

## 2020-05-26 NOTE — Progress Notes (Signed)
Office Visit Note   Patient: Brandon Brown           Date of Birth: 11-Nov-1936           MRN: [id number] Visit Date: 05/26/2020              Requested by: Townsend Roger, MD [address] [place],  [place] [address] PCP: Townsend Roger, MD   Assessment & Plan: Visit Diagnoses:  1. Foraminal stenosis of cervical region     Plan: We sent in a refill of his Neurontin which is helping he is just on 100 mg p.o. twice daily.  I gave him a 1 month refill if he needs further refills he could see if his PCP would be willing to refill it.  I discussed in them generally once a year lab work check liver function and renal function would be recommended.  I do not think he needs a higher dose and has gotten good effect with just low-dose which is typically the case for the problem he has.  We reviewed his cervical MRI scan.  He can return if he has increased symptoms.  Follow-Up Instructions: Return if symptoms worsen or fail to improve.   Orders:  No orders of the defined types were placed in this encounter.  Meds ordered this encounter  Medications  . gabapentin (NEURONTIN) 100 MG capsule    Sig: Take one at night for 4 days then 2 capsules at night    Dispense:  60 capsule    Refill:  1      Procedures: No procedures performed   Clinical Data: No additional findings.   Subjective: Chief Complaint  Patient presents with  . 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  At times has had to apply ice and had an episode where he has significant pain for short period of time.  He denies any numbness or tingling in his hands.  He has had some discomfort on his right forearm proximal third but states the Neurontin seems to have helped and he is requesting refill.  He has been able to walk still work out and has been  active.  Review of Systems previous CVA otherwise noncontributory and updated from 04/23/2020 office visit.   Objective: Vital Signs: There were no vitals taken for this visit.  Physical Exam Constitutional:      Appearance: He is well-developed.  HENT:     Head: Normocephalic and atraumatic.  Eyes:     Pupils: Pupils are equal, round, and reactive to light.  Neck:     Thyroid: No thyromegaly.     Trachea: No tracheal deviation.  Cardiovascular:     Rate and Rhythm: Normal rate.  Pulmonary:     Effort: Pulmonary effort is normal.     Breath sounds: No wheezing.  Abdominal:     General: Bowel sounds are normal.     Palpations: Abdomen is soft.  Skin:    General: Skin is warm and dry.     Capillary Refill: Capillary refill takes less than 2 seconds.  Neurological:     Mental Status: He is alert and oriented to person, place, and time.  Psychiatric:        Behavior: Behavior normal.  Thought Content: Thought content normal.        Judgment: Judgment normal.     Ortho Exam patient is minimal brachial plexus tenderness on the left.  Some tenderness paraspinal muscle adjacent to C7 spinous process where he is had referred pain.  Upper extremity reflexes are 2+.  Specialty Comments:  No specialty comments available.  Imaging: No results found.   PMFS History: Patient Active Problem List   Diagnosis Date Noted  . Foraminal stenosis of cervical region 05/26/2020  . Right forearm pain 04/26/2020  . Numbness in feet 04/26/2020  . Chest pain 03/16/2019  . Benign essential HTN 03/16/2019  . Suspected cerebrovascular accident (CVA) 03/16/2019  . Acute CVA (cerebrovascular accident) ([place]) 03/16/2019  . Prostate cancer ([place]) 12/05/2013   Past Medical History:  Diagnosis Date  . Hx of prostatitis   . Hyperlipidemia   . Hypertension   . Prostate cancer [facility])     Family History  Problem Relation Age of Onset  . Heart disease Mother   . Non-Hodgkin's lymphoma  Mother     Past Surgical History:  Procedure Laterality Date  . FEMUR FRACTURE SURGERY    . HERNIA REPAIR    . INCISION / DRAINAGE HAND / FINGER    . KNEE ARTHROSCOPY    . LOOP RECORDER INSERTION N/A 03/17/2019   Procedure: LOOP RECORDER INSERTION;  Surgeon: Evans Lance, MD;  Location: [place] [facility];  Service: Cardiovascular;  Laterality: N/A;  . PROSTATE BIOPSY    . ROTATOR CUFF REPAIR    . TRANSURETHRAL RESECTION OF PROSTATE    . TRANSURETHRAL RESECTION OF PROSTATE    . tunica vaginalis excision of hydrocele right     Social History   Occupational History  . Not on file  Tobacco Use  . Smoking status: Never Smoker  . Smokeless tobacco: Never Used  Substance and Sexual Activity  . Alcohol use: No  . Drug use: No  . Sexual activity: Never

## 2020-06-14 DIAGNOSIS — M47892 Other spondylosis, cervical region: Secondary | ICD-10-CM | POA: Diagnosis not present

## 2020-06-14 DIAGNOSIS — M9901 Segmental and somatic dysfunction of cervical region: Secondary | ICD-10-CM | POA: Diagnosis not present

## 2020-06-14 DIAGNOSIS — M5388 Other specified dorsopathies, sacral and sacrococcygeal region: Secondary | ICD-10-CM | POA: Diagnosis not present

## 2020-06-14 DIAGNOSIS — M9904 Segmental and somatic dysfunction of sacral region: Secondary | ICD-10-CM | POA: Diagnosis not present

## 2020-06-14 DIAGNOSIS — M9903 Segmental and somatic dysfunction of lumbar region: Secondary | ICD-10-CM | POA: Diagnosis not present

## 2020-06-14 DIAGNOSIS — M4726 Other spondylosis with radiculopathy, lumbar region: Secondary | ICD-10-CM | POA: Diagnosis not present

## 2020-06-15 DIAGNOSIS — Z8546 Personal history of malignant neoplasm of prostate: Secondary | ICD-10-CM | POA: Diagnosis not present

## 2020-06-15 DIAGNOSIS — R3121 Asymptomatic microscopic hematuria: Secondary | ICD-10-CM | POA: Diagnosis not present

## 2020-06-15 DIAGNOSIS — R300 Dysuria: Secondary | ICD-10-CM | POA: Diagnosis not present

## 2020-06-16 DIAGNOSIS — M9901 Segmental and somatic dysfunction of cervical region: Secondary | ICD-10-CM | POA: Diagnosis not present

## 2020-06-16 DIAGNOSIS — M47892 Other spondylosis, cervical region: Secondary | ICD-10-CM | POA: Diagnosis not present

## 2020-06-16 DIAGNOSIS — M9903 Segmental and somatic dysfunction of lumbar region: Secondary | ICD-10-CM | POA: Diagnosis not present

## 2020-06-16 DIAGNOSIS — M9904 Segmental and somatic dysfunction of sacral region: Secondary | ICD-10-CM | POA: Diagnosis not present

## 2020-06-16 DIAGNOSIS — M4726 Other spondylosis with radiculopathy, lumbar region: Secondary | ICD-10-CM | POA: Diagnosis not present

## 2020-06-16 DIAGNOSIS — M5388 Other specified dorsopathies, sacral and sacrococcygeal region: Secondary | ICD-10-CM | POA: Diagnosis not present

## 2020-06-21 ENCOUNTER — Ambulatory Visit (INDEPENDENT_AMBULATORY_CARE_PROVIDER_SITE_OTHER): Payer: Medicare PPO | Admitting: *Deleted

## 2020-06-21 DIAGNOSIS — M9903 Segmental and somatic dysfunction of lumbar region: Secondary | ICD-10-CM | POA: Diagnosis not present

## 2020-06-21 DIAGNOSIS — M9901 Segmental and somatic dysfunction of cervical region: Secondary | ICD-10-CM | POA: Diagnosis not present

## 2020-06-21 DIAGNOSIS — M47892 Other spondylosis, cervical region: Secondary | ICD-10-CM | POA: Diagnosis not present

## 2020-06-21 DIAGNOSIS — M5388 Other specified dorsopathies, sacral and sacrococcygeal region: Secondary | ICD-10-CM | POA: Diagnosis not present

## 2020-06-21 DIAGNOSIS — I639 Cerebral infarction, unspecified: Secondary | ICD-10-CM | POA: Diagnosis not present

## 2020-06-21 DIAGNOSIS — M9904 Segmental and somatic dysfunction of sacral region: Secondary | ICD-10-CM | POA: Diagnosis not present

## 2020-06-21 DIAGNOSIS — M4726 Other spondylosis with radiculopathy, lumbar region: Secondary | ICD-10-CM | POA: Diagnosis not present

## 2020-06-21 DIAGNOSIS — M542 Cervicalgia: Secondary | ICD-10-CM | POA: Diagnosis not present

## 2020-06-21 LAB — CUP PACEART REMOTE DEVICE CHECK
Date Time Interrogation Session: 20210627235931
Implantable Pulse Generator Implant Date: 20200323

## 2020-06-22 NOTE — Progress Notes (Signed)
Carelink Summary Report / Loop Recorder 

## 2020-06-29 DIAGNOSIS — M9903 Segmental and somatic dysfunction of lumbar region: Secondary | ICD-10-CM | POA: Diagnosis not present

## 2020-06-29 DIAGNOSIS — M4726 Other spondylosis with radiculopathy, lumbar region: Secondary | ICD-10-CM | POA: Diagnosis not present

## 2020-06-29 DIAGNOSIS — M9901 Segmental and somatic dysfunction of cervical region: Secondary | ICD-10-CM | POA: Diagnosis not present

## 2020-06-29 DIAGNOSIS — M9904 Segmental and somatic dysfunction of sacral region: Secondary | ICD-10-CM | POA: Diagnosis not present

## 2020-06-29 DIAGNOSIS — M47892 Other spondylosis, cervical region: Secondary | ICD-10-CM | POA: Diagnosis not present

## 2020-06-29 DIAGNOSIS — M5388 Other specified dorsopathies, sacral and sacrococcygeal region: Secondary | ICD-10-CM | POA: Diagnosis not present

## 2020-07-02 DIAGNOSIS — N3041 Irradiation cystitis with hematuria: Secondary | ICD-10-CM | POA: Diagnosis not present

## 2020-07-02 DIAGNOSIS — Z8546 Personal history of malignant neoplasm of prostate: Secondary | ICD-10-CM | POA: Diagnosis not present

## 2020-07-02 DIAGNOSIS — R310 Gross hematuria: Secondary | ICD-10-CM | POA: Diagnosis not present

## 2020-07-02 DIAGNOSIS — R300 Dysuria: Secondary | ICD-10-CM | POA: Diagnosis not present

## 2020-07-03 DIAGNOSIS — M47892 Other spondylosis, cervical region: Secondary | ICD-10-CM | POA: Diagnosis not present

## 2020-07-03 DIAGNOSIS — M9904 Segmental and somatic dysfunction of sacral region: Secondary | ICD-10-CM | POA: Diagnosis not present

## 2020-07-03 DIAGNOSIS — M4726 Other spondylosis with radiculopathy, lumbar region: Secondary | ICD-10-CM | POA: Diagnosis not present

## 2020-07-03 DIAGNOSIS — M9903 Segmental and somatic dysfunction of lumbar region: Secondary | ICD-10-CM | POA: Diagnosis not present

## 2020-07-03 DIAGNOSIS — M5388 Other specified dorsopathies, sacral and sacrococcygeal region: Secondary | ICD-10-CM | POA: Diagnosis not present

## 2020-07-03 DIAGNOSIS — M9901 Segmental and somatic dysfunction of cervical region: Secondary | ICD-10-CM | POA: Diagnosis not present

## 2020-07-09 DIAGNOSIS — M9903 Segmental and somatic dysfunction of lumbar region: Secondary | ICD-10-CM | POA: Diagnosis not present

## 2020-07-09 DIAGNOSIS — M47892 Other spondylosis, cervical region: Secondary | ICD-10-CM | POA: Diagnosis not present

## 2020-07-09 DIAGNOSIS — M9901 Segmental and somatic dysfunction of cervical region: Secondary | ICD-10-CM | POA: Diagnosis not present

## 2020-07-09 DIAGNOSIS — M5388 Other specified dorsopathies, sacral and sacrococcygeal region: Secondary | ICD-10-CM | POA: Diagnosis not present

## 2020-07-09 DIAGNOSIS — M9904 Segmental and somatic dysfunction of sacral region: Secondary | ICD-10-CM | POA: Diagnosis not present

## 2020-07-09 DIAGNOSIS — M4726 Other spondylosis with radiculopathy, lumbar region: Secondary | ICD-10-CM | POA: Diagnosis not present

## 2020-07-26 ENCOUNTER — Ambulatory Visit (INDEPENDENT_AMBULATORY_CARE_PROVIDER_SITE_OTHER): Payer: Medicare PPO | Admitting: *Deleted

## 2020-07-26 DIAGNOSIS — I639 Cerebral infarction, unspecified: Secondary | ICD-10-CM

## 2020-07-26 LAB — CUP PACEART REMOTE DEVICE CHECK
Date Time Interrogation Session: 20210731000320
Implantable Pulse Generator Implant Date: 20200323

## 2020-07-29 NOTE — Progress Notes (Signed)
Carelink Summary Report / Loop Recorder 

## 2020-08-18 DIAGNOSIS — I69391 Dysphagia following cerebral infarction: Secondary | ICD-10-CM | POA: Diagnosis not present

## 2020-08-18 DIAGNOSIS — G629 Polyneuropathy, unspecified: Secondary | ICD-10-CM | POA: Diagnosis not present

## 2020-08-18 DIAGNOSIS — Z9181 History of falling: Secondary | ICD-10-CM | POA: Diagnosis not present

## 2020-08-18 DIAGNOSIS — Z6825 Body mass index (BMI) 25.0-25.9, adult: Secondary | ICD-10-CM | POA: Diagnosis not present

## 2020-08-18 DIAGNOSIS — I10 Essential (primary) hypertension: Secondary | ICD-10-CM | POA: Diagnosis not present

## 2020-08-18 DIAGNOSIS — Z139 Encounter for screening, unspecified: Secondary | ICD-10-CM | POA: Diagnosis not present

## 2020-08-18 DIAGNOSIS — R7303 Prediabetes: Secondary | ICD-10-CM | POA: Diagnosis not present

## 2020-08-18 DIAGNOSIS — Z1331 Encounter for screening for depression: Secondary | ICD-10-CM | POA: Diagnosis not present

## 2020-08-18 DIAGNOSIS — E7800 Pure hypercholesterolemia, unspecified: Secondary | ICD-10-CM | POA: Diagnosis not present

## 2020-08-29 LAB — CUP PACEART REMOTE DEVICE CHECK
Date Time Interrogation Session: 20210902001012
Implantable Pulse Generator Implant Date: 20200323

## 2020-08-31 ENCOUNTER — Ambulatory Visit (INDEPENDENT_AMBULATORY_CARE_PROVIDER_SITE_OTHER): Payer: Medicare PPO | Admitting: *Deleted

## 2020-08-31 DIAGNOSIS — I639 Cerebral infarction, unspecified: Secondary | ICD-10-CM | POA: Diagnosis not present

## 2020-09-01 NOTE — Progress Notes (Signed)
Carelink Summary Report / Loop Recorder 

## 2020-09-23 DIAGNOSIS — M79601 Pain in right arm: Secondary | ICD-10-CM | POA: Diagnosis not present

## 2020-09-23 DIAGNOSIS — M79644 Pain in right finger(s): Secondary | ICD-10-CM | POA: Diagnosis not present

## 2020-09-23 DIAGNOSIS — Z6825 Body mass index (BMI) 25.0-25.9, adult: Secondary | ICD-10-CM | POA: Diagnosis not present

## 2020-09-23 DIAGNOSIS — R202 Paresthesia of skin: Secondary | ICD-10-CM | POA: Diagnosis not present

## 2020-09-28 ENCOUNTER — Ambulatory Visit: Payer: Medicare PPO

## 2020-09-30 LAB — CUP PACEART REMOTE DEVICE CHECK
Date Time Interrogation Session: 20211005001816
Implantable Pulse Generator Implant Date: 20200323

## 2020-10-04 ENCOUNTER — Ambulatory Visit (INDEPENDENT_AMBULATORY_CARE_PROVIDER_SITE_OTHER): Payer: Medicare PPO

## 2020-10-04 DIAGNOSIS — R300 Dysuria: Secondary | ICD-10-CM | POA: Diagnosis not present

## 2020-10-04 DIAGNOSIS — I639 Cerebral infarction, unspecified: Secondary | ICD-10-CM

## 2020-10-04 DIAGNOSIS — Z8546 Personal history of malignant neoplasm of prostate: Secondary | ICD-10-CM | POA: Diagnosis not present

## 2020-10-06 NOTE — Progress Notes (Signed)
Carelink Summary Report / Loop Recorder 

## 2020-10-11 ENCOUNTER — Ambulatory Visit: Payer: Medicare PPO | Attending: Internal Medicine

## 2020-10-11 DIAGNOSIS — Z23 Encounter for immunization: Secondary | ICD-10-CM

## 2020-10-11 NOTE — Progress Notes (Signed)
   Covid-19 Vaccination Clinic  Name:  Brandon Brown    MRN: [id number] DOB: 10/18/1936  10/11/2020  Brandon Brown was observed post Covid-19 immunization for 30 minutes based on pre-vaccination screening without incident. He was provided with Vaccine Information Sheet and instruction to access the V-Safe system.   Brandon Brown was instructed to call 911 with any severe reactions post vaccine: Marland Kitchen Difficulty breathing  . Swelling of face and throat  . A fast heartbeat  . A bad rash all over body  . Dizziness and weakness

## 2020-10-22 DIAGNOSIS — L910 Hypertrophic scar: Secondary | ICD-10-CM | POA: Diagnosis not present

## 2020-10-27 ENCOUNTER — Other Ambulatory Visit: Payer: Self-pay

## 2020-10-27 ENCOUNTER — Encounter: Payer: Self-pay | Admitting: *Deleted

## 2020-10-27 ENCOUNTER — Ambulatory Visit: Payer: Medicare PPO | Admitting: Neurology

## 2020-10-27 ENCOUNTER — Encounter: Payer: Self-pay | Admitting: Neurology

## 2020-10-27 VITALS — BP 152/68 | HR 76 | Ht 72.0 in | Wt 184.0 lb

## 2020-10-27 DIAGNOSIS — M4807 Spinal stenosis, lumbosacral region: Secondary | ICD-10-CM | POA: Diagnosis not present

## 2020-10-27 DIAGNOSIS — R202 Paresthesia of skin: Secondary | ICD-10-CM

## 2020-10-27 DIAGNOSIS — M5417 Radiculopathy, lumbosacral region: Secondary | ICD-10-CM | POA: Diagnosis not present

## 2020-10-27 DIAGNOSIS — R200 Anesthesia of skin: Secondary | ICD-10-CM | POA: Diagnosis not present

## 2020-10-27 DIAGNOSIS — M48062 Spinal stenosis, lumbar region with neurogenic claudication: Secondary | ICD-10-CM

## 2020-10-27 NOTE — Patient Instructions (Addendum)
Ulnar neuropathy in the right arm Either sciatica (lumbar stenosis/radiculopathy) or peroneal neuropathy at the knees EMG/NCS MRI lumbar spine   Electromyoneurogram Electromyoneurogram is a test to check how well your muscles and nerves are working. This procedure includes the combined use of electromyogram (EMG) and nerve conduction study (NCS). EMG is used to look for muscular disorders. NCS, which is also called electroneurogram, measures how well your nerves are controlling your muscles. The procedures are usually done together to check if your muscles and nerves are healthy. If the results of the tests are abnormal, this may indicate disease or injury, such as a neuromuscular disease or peripheral nerve damage. Tell a health care provider about:  Any allergies you have.  All medicines you are taking, including vitamins, herbs, eye drops, creams, and over-the-counter medicines.  Any problems you or family members have had with anesthetic medicines.  Any blood disorders you have.  Any surgeries you have had.  Any medical conditions you have.  If you have a pacemaker.  Whether you are pregnant or may be pregnant. What are the risks? Generally, this is a safe procedure. However, problems may occur, including:  Infection where the electrodes were inserted.  Bleeding. What happens before the procedure? Medicines Ask your health care provider about:  Changing or stopping your regular medicines. This is especially important if you are taking diabetes medicines or blood thinners.  Taking medicines such as aspirin and ibuprofen. These medicines can thin your blood. Do not take these medicines unless your health care provider tells you to take them.  Taking over-the-counter medicines, vitamins, herbs, and supplements. General instructions  Your health care provider may ask you to avoid: ? Beverages that have caffeine, such as coffee and tea. ? Any products that contain nicotine  or tobacco. These products include cigarettes, e-cigarettes, and chewing tobacco. If you need help quitting, ask your health care provider.  Do not use lotions or creams on the same day that you will be having the procedure. What happens during the procedure? For EMG   Your health care provider will ask you to stay in a position so that he or she can access the muscle that will be studied. You may be standing, sitting, or lying down.  You may be given a medicine that numbs the area (local anesthetic).  A very thin needle that has an electrode will be inserted into your muscle.  Another small electrode will be placed on your skin near the muscle.  Your health care provider will ask you to continue to remain still.  The electrodes will send a signal that tells about the electrical activity of your muscles. You may see this on a monitor or hear it in the room.  After your muscles have been studied at rest, your health care provider will ask you to contract or flex your muscles. The electrodes will send a signal that tells about the electrical activity of your muscles.  Your health care provider will remove the electrodes and the electrode needles when the procedure is finished. The procedure may vary among health care providers and hospitals. For NCS   An electrode that records your nerve activity (recording electrode) will be placed on your skin by the muscle that is being studied.  An electrode that is used as a reference (reference electrode) will be placed near the recording electrode.  A paste or gel will be applied to your skin between the recording electrode and the reference electrode.  Your nerve will  be stimulated with a mild shock. Your health care provider will measure how much time it takes for your muscle to react.  Your health care provider will remove the electrodes and the gel when the procedure is finished. The procedure may vary among health care providers and  hospitals. What happens after the procedure?  It is up to you to get the results of your procedure. Ask your health care provider, or the department that is doing the procedure, when your results will be ready.  Your health care provider may: ? Give you medicines for any pain. ? Monitor the insertion sites to make sure that bleeding stops. Summary  Electromyoneurogram is a test to check how well your muscles and nerves are working.  If the results of the tests are abnormal, this may indicate disease or injury.  This is a safe procedure. However, problems may occur, such as bleeding and infection.  Your health care provider will do two tests to complete this procedure. One checks your muscles (EMG) and another checks your nerves (NCS).  It is up to you to get the results of your procedure. Ask your health care provider, or the department that is doing the procedure, when your results will be ready. This information is not intended to replace advice given to you by your health care provider. Make sure you discuss any questions you have with your health care provider. Document Revised: 08/27/2018 Document Reviewed: 08/09/2018 Elsevier Patient Education  [place].    Spinal Stenosis  Spinal stenosis occurs when the open space (spinal canal) between the bones of your spine (vertebrae) narrows, putting pressure on the spinal cord or nerves. What are the causes? This condition is caused by areas of bone pushing into the central canals of your vertebrae. This condition may be present at birth (congenital), or it may be caused by:  Arthritic deterioration of your vertebrae (spinal degeneration). This usually starts around age 52.  Injury or trauma to the spine.  Tumors in the spine.  Calcium deposits in the spine. What are the signs or symptoms? Symptoms of this condition include:  Pain in the neck or back that is generally worse with activities, particularly when standing  and walking.  Numbness, tingling, hot or cold sensations, weakness, or weariness in your legs.  Pain going up and down the leg (sciatica).  Frequent episodes of falling.  A foot-slapping gait that leads to muscle weakness. In more serious cases, you may develop:  Problemspassing stool or passing urine.  Difficulty having sex.  Loss of feeling in part or all of your leg. Symptoms may come on slowly and get worse over time. How is this diagnosed? This condition is diagnosed based on your medical history and a physical exam. Tests will also be done, such as:  MRI.  CT scan.  X-ray. How is this treated? Treatment for this condition often focuses on managing your pain and any other symptoms. Treatment may include:  Practicing good posture to lessen pressure on your nerves.  Exercising to strengthen muscles, build endurance, improve balance, and maintain good joint movement (range of motion).  Losing weight, if needed.  Taking medicines to reduce swelling, inflammation, or pain.  Assistive devices, such as a corset or brace. In some cases, surgery may be needed. The most common procedure is decompression laminectomy. This is done to remove excess bone that puts pressure on your nerve roots. Follow these instructions at home: Managing pain, stiffness, and swelling  Do all exercises  and stretches as told by your health care provider.  Practice good posture. If you were given a brace or a corset, wear it as told by your health care provider.  Do not do any activities that cause pain. Ask your health care provider what activities are safe for you.  Do not lift anything that is heavier than 10 lb (4.5 kg) or the limit that your health care provider tells you.  Maintain a healthy weight. Talk with your health care provider if you need help losing weight.  If directed, apply heat to the affected area as often as told by your health care provider. Use the heat source that your  health care provider recommends, such as a moist heat pack or a heating pad. ? Place a towel between your skin and the heat source. ? Leave the heat on for 20-30 minutes. ? Remove the heat if your skin turns bright red. This is especially important if you are not able to feel pain, heat, or cold. You may have a greater risk of getting burned. General instructions  Take over-the-counter and prescription medicines only as told by your health care provider.  Do not use any products that contain nicotine or tobacco, such as cigarettes and e-cigarettes. If you need help quitting, ask your health care provider.  Eat a healthy diet. This includes plenty of fruits and vegetables, whole grains, and low-fat (lean) protein.  Keep all follow-up visits as told by your health care provider. This is important. Contact a health care provider if:  Your symptoms do not get better or they get worse.  You have a fever. Get help right away if:  You have new or worse pain in your neck or upper back.  You have severe pain that cannot be controlled with medicines.  You are dizzy.  You have vision problems, blurred vision, or double vision.  You have a severe headache that is worse when you stand.  You have nausea or you vomit.  You develop new or worse numbness or tingling in your back or legs.  You have pain, redness, swelling, or warmth in your arm or leg. Summary  Spinal stenosis occurs when the open space (spinal canal) between the bones of your spine (vertebrae) narrows. This narrowing puts pressure on the spinal cord or nerves.  Spinal stenosis can cause numbness, weakness, or pain in the neck, back, and legs.  This condition may be caused by a birth defect, arthritic deterioration of your vertebrae, injury, tumors, or calcium deposits.  This condition is usually diagnosed with MRIs, CT scans, and X-rays. This information is not intended to replace advice given to you by your health care  provider. Make sure you discuss any questions you have with your health care provider. Document Revised: 11/23/2017 Document Reviewed: 11/15/2016 Elsevier Patient Education  [place].       Common Peroneal Nerve Entrapment  Common peroneal nerve entrapment is a condition that can make it hard to lift a foot. The condition results from pressure on a nerve in the lower leg called the common peroneal nerve. Your common peroneal nerve provides feeling to your outer lower leg and foot. It also supplies the muscles that move your foot and toes upward and outward. What are the causes? This condition may be caused by:  Sitting cross-legged, squatting, or kneeling for long periods of time.  A hard, direct hit to the side of the lower leg.  Swelling from a knee injury.  A  break (fracture) in one of the lower leg bones.  Wearing a boot or cast that ends just below the knee.  A growth or cyst near the nerve. What increases the risk? This condition is more likely to develop in people who play:  Contact sports, such as football or hockey.  Sports where you wear high and stiff boots, such as skiing. What are the signs or symptoms? Symptoms of this condition include:  Trouble lifting your foot up (foot drop).  Tripping often.  Your foot hitting the ground harder than normal as you walk.  Numbness, tingling, or pain in the outside of the knee, outside of the lower leg, and top of the foot.  Sensitivity to pressure on the front or side of the leg. How is this diagnosed? This condition may be diagnosed based on:  Your symptoms.  Your medical history.  A physical exam.  Tests, such as: ? An X-ray to check the bones of your knee and leg. ? MRI to check tendons that attach to the side of your knee. ? An ultrasound to check for a growth or cyst. ? An electromyogram (EMG) to check your nerves. During your physical exam, your health care provider will check for numbness in  your leg and test the strength of your lower leg muscles. He or she may tap the side of your lower leg to see if that causes tingling. How is this treated? Treatment for this condition may include:  Avoiding activities that make symptoms worse.  Using a brace to hold up your foot and toes.  Taking anti-inflammatory pain medicines to relieve swelling and lessen pain.  Having medicines injected into your ankle joint to lessen pain and swelling.  Doing exercises to help you regain or maintain movement (physical therapy).  Surgery to take pressure off the nerve. This may be needed if there is no improvement after 2-3 months or if there is a growth pushing on the nerve.  Returning gradually to full activity. Follow these instructions at home: If you have a brace:  Wear it as told by your health care provider. Remove it only as told by your health care provider.  Loosen the brace if your toes tingle, become numb, or turn cold and blue.  Keep the brace clean.  If the brace is not waterproof: ? Do not let it get wet. ? Cover it with a watertight covering when you take a bath or a shower.  Ask your health care provider when it is safe to drive with a brace on your foot. Activity  Return to your normal activities as told by your health care provider. Ask your health care provider what activities are safe for you.  Do not do any activities that make pain or swelling worse.  Do exercises as told by your health care provider. General instructions  Take over-the-counter and prescription medicines only as told by your health care provider.  Do not put your full weight on your knee until your health care provider says you can. Use crutches as directed by your health care provider.  Keep all follow-up visits as told by your health care provider. This is important. How is this prevented?  Wear supportive footwear that is appropriate for your athletic activity.  Avoid athletic  activities that cause ankle pain or swelling.  Wear protective padding over your lower legs when playing contact sports.  Make sure your boots do not put extra pressure on the area just below your knees.  Do not sit cross-legged for long periods of time. Contact a health care provider if:  Your symptoms do not get better in 2-3 months.  The weakness or numbness in your leg or foot gets worse. Summary  Common peroneal nerve entrapment is a condition that results from pressure on a nerve in the lower leg called the common peroneal nerve.  This condition may be caused by a hard hit, swelling, a fracture, or a cyst in the lower leg.  Treatment may include rest, a brace, medicines, and physical therapy. Sometimes surgery is needed.  Do not do any activities that make pain or swelling worse. This information is not intended to replace advice given to you by your health care provider. Make sure you discuss any questions you have with your health care provider. Document Revised: 10/21/2018 Document Reviewed: 10/21/2018 Elsevier Patient Education  [place].  Sciatica  Sciatica is pain, numbness, weakness, or tingling along the path of the sciatic nerve. The sciatic nerve starts in the lower back and runs down the back of each leg. The nerve controls the muscles in the lower leg and in the back of the knee. It also provides feeling (sensation) to the back of the thigh, the lower leg, and the sole of the foot. Sciatica is a symptom of another medical condition that pinches or puts pressure on the sciatic nerve. Sciatica most often only affects one side of the body. Sciatica usually goes away on its own or with treatment. In some cases, sciatica may come back (recur). What are the causes? This condition is caused by pressure on the sciatic nerve or pinching of the nerve. This may be the result of:  A disk in between the bones of the spine bulging out too far (herniated  disk).  Age-related changes in the spinal disks.  A pain disorder that affects a muscle in the buttock.  Extra bone growth near the sciatic nerve.  A break (fracture) of the pelvis.  Pregnancy.  Tumor. This is rare. What increases the risk? The following factors may make you more likely to develop this condition:  Playing sports that place pressure or stress on the spine.  Having poor strength and flexibility.  A history of back injury or surgery.  Sitting for long periods of time.  Doing activities that involve repetitive bending or lifting.  Obesity. What are the signs or symptoms? Symptoms can vary from mild to very severe, and they may include:  Any of these problems in the lower back, leg, hip, or buttock: ? Mild tingling, numbness, or dull aches. ? Burning sensations. ? Sharp pains.  Numbness in the back of the calf or the sole of the foot.  Leg weakness.  Severe back pain that makes movement difficult. Symptoms may get worse when you cough, sneeze, or laugh, or when you sit or stand for long periods of time. How is this diagnosed? This condition may be diagnosed based on:  Your symptoms and medical history.  A physical exam.  Blood tests.  Imaging tests, such as: ? X-rays. ? MRI. ? CT scan. How is this treated? In many cases, this condition improves on its own without treatment. However, treatment may include:  Reducing or modifying physical activity.  Exercising and stretching.  Icing and applying heat to the affected area.  Medicines that help to: ? Relieve pain and swelling. ? Relax your muscles.  Injections of medicines that help to relieve pain, irritation, and inflammation around the sciatic nerve (  steroids).  Surgery. Follow these instructions at home: Medicines  Take over-the-counter and prescription medicines only as told by your health care provider.  Ask your health care provider if the medicine prescribed to  you: ? Requires you to avoid driving or using heavy machinery. ? Can cause constipation. You may need to take these actions to prevent or treat constipation:  Drink enough fluid to keep your urine pale yellow.  Take over-the-counter or prescription medicines.  Eat foods that are high in fiber, such as beans, whole grains, and fresh fruits and vegetables.  Limit foods that are high in fat and processed sugars, such as fried or sweet foods. Managing pain      If directed, put ice on the affected area. ? Put ice in a plastic bag. ? Place a towel between your skin and the bag. ? Leave the ice on for 20 minutes, 2-3 times a day.  If directed, apply heat to the affected area. Use the heat source that your health care provider recommends, such as a moist heat pack or a heating pad. ? Place a towel between your skin and the heat source. ? Leave the heat on for 20-30 minutes. ? Remove the heat if your skin turns bright red. This is especially important if you are unable to feel pain, heat, or cold. You may have a greater risk of getting burned. Activity   Return to your normal activities as told by your health care provider. Ask your health care provider what activities are safe for you.  Avoid activities that make your symptoms worse.  Take brief periods of rest throughout the day. ? When you rest for longer periods, mix in some mild activity or stretching between periods of rest. This will help to prevent stiffness and pain. ? Avoid sitting for long periods of time without moving. Get up and move around at least one time each hour.  Exercise and stretch regularly, as told by your health care provider.  Do not lift anything that is heavier than 10 lb (4.5 kg) while you have symptoms of sciatica. When you do not have symptoms, you should still avoid heavy lifting, especially repetitive heavy lifting.  When you lift objects, always use proper lifting technique, which  includes: ? Bending your knees. ? Keeping the load close to your body. ? Avoiding twisting. General instructions  Maintain a healthy weight. Excess weight puts extra stress on your back.  Wear supportive, comfortable shoes. Avoid wearing high heels.  Avoid sleeping on a mattress that is too soft or too hard. A mattress that is firm enough to support your back when you sleep may help to reduce your pain.  Keep all follow-up visits as told by your health care provider. This is important. Contact a health care provider if:  You have pain that: ? Wakes you up when you are sleeping. ? Gets worse when you lie down. ? Is worse than you have experienced in the past. ? Lasts longer than 4 weeks.  You have an unexplained weight loss. Get help right away if:  You are not able to control when you urinate or have bowel movements (incontinence).  You have: ? Weakness in your lower back, pelvis, buttocks, or legs that gets worse. ? Redness or swelling of your back. ? A burning sensation when you urinate. Summary  Sciatica is pain, numbness, weakness, or tingling along the path of the sciatic nerve.  This condition is caused by pressure on the sciatic nerve  or pinching of the nerve.  Sciatica can cause pain, numbness, or tingling in the lower back, legs, hips, and buttocks.  Treatment often includes rest, exercise, medicines, and applying ice or heat. This information is not intended to replace advice given to you by your health care provider. Make sure you discuss any questions you have with your health care provider. Document Revised: 12/30/2018 Document Reviewed: 12/30/2018 Elsevier Patient Education  [place].   Ulnar neuropathy  Cubital tunnel syndrome is a condition that causes pain and weakness of the forearm and hand. It happens when one of the nerves that runs along the inside of the elbow joint (ulnar nerve) becomes irritated. This condition is usually caused by  repeated arm motions that are done during sports or work-related activities. What are the causes? This condition may be caused by:  Increased pressure on the ulnar nerve at the elbow, arm, or forearm. This can result from: ? Irritation caused by repeated elbow bending. ? Poorly healed elbow fractures. ? Tumors in the elbow. These are usually noncancerous (benign). ? Scar tissue that develops in the elbow after an injury. ? Bony growths (spurs) near the ulnar nerve.  Stretching of the nerve due to loose elbow ligaments.  Trauma to the nerve at the elbow. What increases the risk? The following factors may make you more likely to develop this condition:  Doing manual labor that requires frequent bending of the elbow.  Playing sports that include repeated or strenuous throwing motions, such as baseball.  Playing contact sports, such as football or lacrosse.  Not warming up properly before activities.  Having diabetes.  Having an underactive thyroid (hypothyroidism). What are the signs or symptoms? Symptoms of this condition include:  Clumsiness or weakness of the hand.  Tenderness of the inner elbow.  Aching or soreness of the inner elbow, forearm, or fingers, especially the little finger or the ring finger.  Increased pain when forcing the elbow to bend.  Reduced control when throwing objects.  Tingling, numbness, or a burning feeling inside the forearm or in part of the hand or fingers, especially the little finger or the ring finger.  Sharp pains that shoot from the elbow down to the wrist and hand.  The inability to grip or pinch hard. How is this diagnosed? This condition is diagnosed based on:  Your symptoms and medical history. Your health care provider will also ask for details about any injury.  A physical exam. You may also have tests, including:  Electromyogram (EMG). This test measures electrical signals sent by your nerves into the muscles.  Nerve  conduction study. This test measures how well electrical signals pass through your nerves.  Imaging tests, such as X-rays, ultrasound, and MRI. These tests check for possible causes of your condition. How is this treated? This condition may be treated by:  Stopping the activities that are causing your symptoms to get worse.  Icing and taking medicines to reduce pain and swelling.  Wearing a splint to prevent your elbow from bending, or wearing an elbow pad where the ulnar nerve is closest to the skin.  Working with a physical therapist in less severe cases. This may help to: ? Decrease your symptoms. ? Improve the strength and range of motion of your elbow, forearm, and hand. If these treatments do not help, surgery may be needed. Follow these instructions at home: If you have a splint:  Wear the splint as told by your health care provider. Remove it only  as told by your health care provider.  Loosen the splint if your fingers tingle, become numb, or turn cold and blue.  Keep the splint clean.  If the splint is not waterproof: ? Do not let it get wet. ? Cover it with a watertight covering when you take a bath or shower. Managing pain, stiffness, and swelling   If directed, put ice on the injured area: ? Put ice in a plastic bag. ? Place a towel between your skin and the bag. ? Leave the ice on for 20 minutes, 2-3 times a day.  Move your fingers often to avoid stiffness and to lessen swelling.  Raise (elevate) the injured area above the level of your heart while you are sitting or lying down. General instructions  Take over-the-counter and prescription medicines only as told by your health care provider.  Do any exercise or physical therapy as told by your health care provider.  Do not drive or use heavy machinery while taking prescription pain medicine.  If you were given an elbow pad, wear it as told by your health care provider.  Keep all follow-up visits as told by  your health care provider. This is important. Contact a health care provider if:  Your symptoms get worse.  Your symptoms do not get better with treatment.  You have new pain.  Your hand on the injured side feels numb or cold. Summary  Cubital tunnel syndrome is a condition that causes pain and weakness of the forearm and hand.  You are more likely to develop this condition if you do work or play sports that involve repeated arm movements.  This condition is often treated by stopping repetitive activities, applying ice, and using anti-inflammatory medicines.  In rare cases, surgery may be needed. This information is not intended to replace advice given to you by your health care provider. Make sure you discuss any questions you have with your health care provider. Document Revised: 04/29/2018 Document Reviewed: 04/29/2018 Elsevier Patient Education  [place].

## 2020-10-27 NOTE — Progress Notes (Addendum)
[facility]    Provider:  Dr Jaynee Eagles Requesting Provider: Townsend Roger, MD Primary Care Provider:  Cyndi Bender, PA-C  CC:  Numbness from knees down  HPI:  Brandon Brown is a 84 y.o. male here as requested by Townsend Roger, MD for peripheral neuropathy.  Past medical history hypercholesteremia, arthritis, hypertension, peripheral neuropathy, dysphagia due to old stroke, prediabetes.  He is on vitamin B12 daily (B12 deficiency?).  I reviewed Dr. Doran Durand notes: Patient has a history of peripheral neuropathy, symptoms have been slowly worsening over longer than a year, he did not find gabapentin very helpful, more worried about figuring out why he has persistent numbness tingling and intermittent pain in the legs, he denies any back pain, no weakness in the legs, no neuropathy in the upper extremities, he does have history of stroke in March 2020 with some residual difficulty with swallowing at times mild no choking episodes, no persistent weakness in the extremities, he did see neurology once post stroke, he remains on chronic aspirin therapy and has an implantable cardiac monitor, he also has hypertension, prediabetes hemoglobin A1c 6.0, hyperlipidemia and history of prostate cancer status post TURP and radiation therapy.  In March 2020 he had a stroke.He went back to doing everything he already did, no weakness or residual. He started noticing burning/tingling from the knees down and in his feet also. At night when he lays on his right side his arm goes to sleep.  Symptoms started this year. His right arm/hand is numb. He still exercises regularly. More prevalent on the left side, more in the lower leg then in the feet. In his left calf there is a spot there he can feel when he presses down on the treadmill. More on the lateral part of the leg to the top of the lateral foot. No back issues. Unknown inciting events. Seemed to be something that just started gradually, slowly  progressed, no weakness in the legs. In the hand its more digits 4-5 of the right hand. Frequently numb in the hand moreso at night. The legs bother him more when he is sitting or when he goes to [place] and shops his legs are worse too and burning.  Being on a shopping helps though. No other focal neurologic deficits, associated symptoms, inciting events or modifiable factors.  Reviewed notes, labs and imaging from outside physicians, which showed: see above  BUN 17, creatinine 0.93, B12 1105, HgbA1c 6  Reviewed images and agree with the following: MRI brain 3/202  0IMPRESSION: Acute infarct right MCA territory. Multiple small areas of acute infarct in the right frontal parietal cortex and white matter with sparing of the basal ganglia. Negative for hemorrhage.  CT Head 03/16/2019: IMPRESSION: 1. Small area of hypoattenuation in the cortical and subcortical matter of the right parietal lobe. This likely represents an age-indeterminate ischemic infarct. 2. No evidence of intracranial hemorrhage.   Reviewed reports: 03/16/2019 CTA H&N:  FINDINGS: CTA NECK FINDINGS  Aortic arch: Atherosclerotic calcifications are present within the aortic arch and at the origins the great vessels without focal aneurysm or stenosis.  Right carotid system: The right common carotid artery demonstrates change without focal stenosis. There are dense calcifications at the proximal right ICA. There is no significant stenosis of greater than 50% relative to the more distal vessel.  Left carotid system: Atherosclerotic calcifications are present within the distal left common carotid artery and through the bifurcation without a significant stenosis relative to the more distal vessel.  Vertebral arteries: There is a 50% stenosis of the right vertebral artery at its origin. The left vertebral artery is hypoplastic. There is no significant stenosis of either vertebral artery in the neck. Minimal  calcifications are present in the right vertebral artery at the C1 level.  Skeleton: Slight anterolisthesis present at C4-5 and C5-6. There are chronic endplate change D6-2 with fusion. Multilevel facet spurring is evident bilaterally. No focal lytic or blastic lesions are present.  Other neck: Soft tissues the neck are otherwise unremarkable. No focal mucosal or submucosal lesions are present. Thyroid is within normal limits. Salivary glands are within normal limits.  Upper chest: Lung apices are clear. Thoracic inlet is within normal limits.  Review of the MIP images confirms the above findings  CTA HEAD FINDINGS  Anterior circulation: Atherosclerotic calcifications are present within the cavernous internal carotid arteries bilaterally. There is no significant stenosis through the ICA termini. The A1 segments are within normal limits bilaterally. No anterior communicating artery is present. The left M1 segment is normal. There is a high-grade stenosis at the proximal right M1 segment with marked attenuation of distal right MCA branch vessels.  Posterior circulation: There is moderate stenosis of the left V4 segment. PICA origins are visualized and normal. Vertebrobasilar junction is normal. The basilar artery is small. Both posterior cerebral arteries originate from basilar tip. There is a high-grade stenosis of the right P2 segment. There is attenuation of distal branch vessels bilaterally.  Venous sinuses: The dural sinuses are patent. Right transverse sinus is dominant. Straight sinus and deep cerebral veins are intact. Cortical veins are unremarkable.  Anatomic variants: None  Delayed phase: Delayed images demonstrate no pathologic enhancement. Right MCA territory infarcts are noted.  Review of the MIP images confirms the above findings  IMPRESSION: 1. High-grade stenosis of the proximal right M1 segment, likely accounting for the infarcts. 2.  Atherosclerotic changes at the carotid bifurcations bilaterally without other significant focal stenosis. 3. High-grade stenosis of the right P2 segment.    Review of Systems: Patient complains of symptoms per HPI as well as the following symptoms numbness. Pertinent negatives and positives per HPI. All others negative.   Social History   Socioeconomic History  . Marital status: Married    Spouse name: Not on file  . Number of children: Not on file  . Years of education: Not on file  . Highest education level: Not on file  Occupational History  . Not on file  Tobacco Use  . Smoking status: Never Smoker  . Smokeless tobacco: Former Systems developer    Types: Chew  Substance and Sexual Activity  . Alcohol use: Yes    Comment: 1 beer every once in awhile   . Drug use: No  . Sexual activity: Never  Other Topics Concern  . Not on file  Social History Narrative   Lives at home   Caffeine: never   Social Determinants of Health   Financial Resource Strain:   . Difficulty of Paying Living Expenses: Not on file  Food Insecurity:   . Worried About Charity fundraiser in the Last Year: Not on file  . Ran Out of Food in the Last Year: Not on file  Transportation Needs:   . Lack of Transportation (Medical): Not on file  . Lack of Transportation (Non-Medical): Not on file  Physical Activity:   . Days of Exercise per Week: Not on file  . Minutes of Exercise per Session: Not on file  Stress:   .  Feeling of Stress : Not on file  Social Connections:   . Frequency of Communication with Friends and Family: Not on file  . Frequency of Social Gatherings with Friends and Family: Not on file  . Attends Religious Services: Not on file  . Active Member of Clubs or Organizations: Not on file  . Attends Archivist Meetings: Not on file  . Marital Status: Not on file  Intimate Partner Violence:   . Fear of Current or Ex-Partner: Not on file  . Emotionally Abused: Not on file  . Physically  Abused: Not on file  . Sexually Abused: Not on file    Family History  Problem Relation Age of Onset  . Heart disease Mother   . Non-Hodgkin's lymphoma Mother   . Hypertension Father        per notes from [facility] at [place]  . Arthritis Father        per notes from [facility] at [place]  . Heart failure Father   . Diabetes Brother        per notes from [facility] [place]  . Heart attack Other        paternal grandparent (per notes from [facility] at [place])    Past Medical History:  Diagnosis Date  . Amputation finger    lost middle finger L hand due to accident  . Arthritis    per notes from [facility] at [place]  . Dysphagia due to old stroke    per notes from [facility] at [place]  . Hx of prostatitis   . Hyperlipidemia   . Hypertension   . Peripheral neuropathy    per notes from [facility] at [place]  . Prediabetes    per notes from [facility] at [place]  . Prostate cancer ([place])   . Skin cancer    per notes from [facility]    Patient Active Problem List   Diagnosis Date Noted  . Foraminal stenosis of cervical region 05/26/2020  . Right forearm pain 04/26/2020  . Numbness in feet 04/26/2020  . Chest pain 03/16/2019  . Benign essential HTN 03/16/2019  . Suspected cerebrovascular accident (CVA) 03/16/2019  . Acute CVA (cerebrovascular accident) ([place]) 03/16/2019  . Prostate cancer ([place]) 12/05/2013    Past Surgical History:  Procedure Laterality Date  . bone spur left shoulder    . EXCISION GANGLION CYST RIGHT ANKLE  11/15/2015   Dr Creig Hines; per notes from [facility] [place]  . FEMUR FRACTURE SURGERY    . FINGER AMPUTATION Left    LEFT LONG FINGER (per notes from [facility] [place])  . HERNIA REPAIR  Bilateral    "left and right lower abdominal hernia"  . HYDROCELE EXCISION Left   . INCISION / DRAINAGE HAND / FINGER    . KNEE ARTHROSCOPY    . LOOP RECORDER INSERTION N/A 03/17/2019   Procedure: LOOP RECORDER INSERTION;  Surgeon: Evans Lance, MD;  Location: [place] [facility];  Service: Cardiovascular;  Laterality: N/A;  . PROSTATE BIOPSY    . ROTATOR CUFF REPAIR Left    per notes from [facility] at [place]  . TRANSURETHRAL RESECTION OF PROSTATE    . TRANSURETHRAL RESECTION OF PROSTATE    . tunica vaginalis excision of hydrocele right      Current  Outpatient Medications  Medication Sig Dispense Refill  . amLODipine (NORVASC) 5 MG tablet Take 5 mg by mouth daily.    . Ascorbic Acid (VITAMIN C) 500 MG CAPS Take by mouth.    Marland Kitchen aspirin EC 81 MG tablet Take 81 mg by mouth daily.    Marland Kitchen lisinopril (PRINIVIL,ZESTRIL) 40 MG tablet Take 40 mg by mouth daily after breakfast.    . MAGNESIUM MALATE PO Take 1,300 mg by mouth.    . omega-3 fish oil (MAXEPA) 1000 MG CAPS capsule Take 1 capsule by mouth daily.    Marland Kitchen OVER THE COUNTER MEDICATION 1,000 mg daily. Beet root     . OVER THE COUNTER MEDICATION Take by mouth as needed (pain). CURCUMIN    . rosuvastatin (CRESTOR) 20 MG tablet Take 20 mg by mouth every other day.    . vitamin B-12 (CYANOCOBALAMIN) 1000 MCG tablet Take 1,000 mcg by mouth at bedtime.    Marland Kitchen zinc gluconate 50 MG tablet Take 50 mg by mouth at bedtime.    . Meth-Hyo-M Bl-Na Phos-Ph Sal (URO-MP) 118 MG CAPS Take 1 capsule by mouth as needed.     No current facility-administered medications for this visit.    Allergies as of 10/27/2020 - Review Complete 10/27/2020  Allergen Reaction Noted  . Penicillins Other (See Comments) 12/05/2013  . Statins Other (See Comments) 12/05/2013    Vitals: BP (!) 152/68 (BP Location: Right Arm, Patient Position: Sitting)   Pulse 76   Ht 6' (1.829 m)   Wt 184 lb (83.5 kg)   BMI 24.95 kg/m  Last Weight:  Wt Readings  from Last 1 Encounters:  10/27/20 184 lb (83.5 kg)   Last Height:   Ht Readings from Last 1 Encounters:  10/27/20 6' (1.829 m)     Physical exam: Exam: Gen: NAD, conversant, well nourised, well groomed                     CV: RRR, no MRG. No Carotid Bruits. No peripheral edema, warm, nontender Eyes: Conjunctivae clear without exudates or hemorrhage  Neuro: Detailed Neurologic Exam  Speech:    Speech is normal; fluent and spontaneous with normal comprehension.  Cognition:    The patient is oriented to person, place, and time;     recent and remote memory intact;     language fluent;     normal attention, concentration,     fund of knowledge Cranial Nerves:    The pupils are equal, round, and reactive to light. Pupils too small to visualize fundi. Visual fields are full to finger confrontation. Extraocular movements are intact. Trigeminal sensation is intact and the muscles of mastication are normal. The face is symmetric. The palate elevates in the midline. Hearing intact. Voice is normal. Shoulder shrug is normal. The tongue has normal motion without fasciculations.   Coordination:    No dysmetria or ataxia  Gait:    Good arm swing and stride  Motor Observation:    No asymmetry, no atrophy, and no involuntary movements noted. Tone:    Normal muscle tone.    Posture:    Posture is normal. normal erect    Strength:    Strength is V/V in the upper and lower limbs.      Sensation: intact to LT     Reflex Exam:  DTR's:    Deep tendon reflexes in the upper and lower extremities are symmetrical bilaterally.   Toes:    The toes are downgoing bilaterally.  Clonus:    Clonus is absent.    Assessment/Plan:  84 year old with numbness in ulnar distribution of the right hand and in the peroneal vs sciatic distribution of the lower legs. He also has signs of neurogenic claudication will need MRI of the lumbar spine to evaluate for spinal stenosis and L5/S1 radiculopathy.  EMG/NCS as well.  EMG/NCS: Bilateral superficial peroneal sensory and motor (recording at the Anterior Tibialis) conductions. Right arm to evaluate for ulnar neuropathy at the elbow or wrist.   Orders Placed This Encounter  Procedures  . MR LUMBAR SPINE WO CONTRAST  . NCV with EMG(electromyography)    Cc: Townsend Roger, MD,  Cyndi Bender, PA-C  Sarina Ill, MD  [facility] [address] [place], [place] [address]  Phone [phone] Fax [phone]  I spent over 80 minutes of face-to-face and non-face-to-face time with patient on the  1. Numbness and tingling   2. Spinal stenosis of lumbosacral region   3. Neurogenic claudication due to lumbar spinal stenosis   4. Lumbosacral radiculopathy    diagnosis.  This included previsit chart review, lab review, study review, order entry, electronic health record documentation, patient education on the different diagnostic and therapeutic options, counseling and coordination of care, risks and benefits of management, compliance, or risk factor reduction

## 2020-10-27 NOTE — Progress Notes (Signed)
Per referral notes from [facility] at [place].

## 2020-10-27 NOTE — Addendum Note (Signed)
Addended by: Sarina Ill B on: 10/27/2020 06:33 PM   Modules accepted: Level of Service

## 2020-10-28 ENCOUNTER — Telehealth: Payer: Self-pay | Admitting: Neurology

## 2020-10-28 NOTE — Telephone Encounter (Signed)
Mcarthur Rossetti Josem Kaufmann: [id number] (exp. 10/28/20 to 11/27/20) order sent to GI . They will reach out to the patient to schedule.

## 2020-10-31 LAB — CUP PACEART REMOTE DEVICE CHECK
Date Time Interrogation Session: 20211107005001
Implantable Pulse Generator Implant Date: 20200323

## 2020-11-02 DIAGNOSIS — E7800 Pure hypercholesterolemia, unspecified: Secondary | ICD-10-CM | POA: Diagnosis not present

## 2020-11-02 DIAGNOSIS — Z0000 Encounter for general adult medical examination without abnormal findings: Secondary | ICD-10-CM | POA: Diagnosis not present

## 2020-11-02 DIAGNOSIS — I739 Peripheral vascular disease, unspecified: Secondary | ICD-10-CM | POA: Diagnosis not present

## 2020-11-02 DIAGNOSIS — I69391 Dysphagia following cerebral infarction: Secondary | ICD-10-CM | POA: Diagnosis not present

## 2020-11-02 DIAGNOSIS — I10 Essential (primary) hypertension: Secondary | ICD-10-CM | POA: Diagnosis not present

## 2020-11-02 DIAGNOSIS — G629 Polyneuropathy, unspecified: Secondary | ICD-10-CM | POA: Diagnosis not present

## 2020-11-02 DIAGNOSIS — Z8546 Personal history of malignant neoplasm of prostate: Secondary | ICD-10-CM | POA: Diagnosis not present

## 2020-11-02 DIAGNOSIS — Z6825 Body mass index (BMI) 25.0-25.9, adult: Secondary | ICD-10-CM | POA: Diagnosis not present

## 2020-11-02 DIAGNOSIS — R7303 Prediabetes: Secondary | ICD-10-CM | POA: Diagnosis not present

## 2020-11-08 ENCOUNTER — Ambulatory Visit (INDEPENDENT_AMBULATORY_CARE_PROVIDER_SITE_OTHER): Payer: Medicare PPO

## 2020-11-08 DIAGNOSIS — I639 Cerebral infarction, unspecified: Secondary | ICD-10-CM | POA: Diagnosis not present

## 2020-11-09 ENCOUNTER — Other Ambulatory Visit: Payer: Medicare PPO

## 2020-11-09 NOTE — Progress Notes (Signed)
Carelink Summary Report / Loop Recorder 

## 2020-11-11 ENCOUNTER — Other Ambulatory Visit: Payer: Self-pay

## 2020-11-11 ENCOUNTER — Ambulatory Visit
Admission: RE | Admit: 2020-11-11 | Discharge: 2020-11-11 | Disposition: A | Discharge status: Home / Self Care | Payer: Medicare PPO | Source: Ambulatory Visit | Attending: Neurology | Admitting: Neurology

## 2020-11-11 DIAGNOSIS — M5417 Radiculopathy, lumbosacral region: Secondary | ICD-10-CM

## 2020-11-11 DIAGNOSIS — M4807 Spinal stenosis, lumbosacral region: Secondary | ICD-10-CM | POA: Diagnosis not present

## 2020-11-11 DIAGNOSIS — R200 Anesthesia of skin: Secondary | ICD-10-CM | POA: Diagnosis not present

## 2020-11-11 DIAGNOSIS — M48062 Spinal stenosis, lumbar region with neurogenic claudication: Secondary | ICD-10-CM

## 2020-11-15 ENCOUNTER — Telehealth: Payer: Self-pay | Admitting: *Deleted

## 2020-11-15 NOTE — Telephone Encounter (Signed)
Called pt and discussed results of MRI lumbar spine per Dr Jaynee Eagles. The pt verbalized understanding and his questions were answered. He also wanted to note that in highschool he broke his distal femur while playing football and when it healed the doctor told him he broke it at his "growth spot". Pt stated his right leg ended up being 7 mm shorter than the L leg and he wears a lift. He is curious if this has anything to do with the changes in back. I advised he would need to ask the surgeon. He also stated his daughter bought him compression socks and his legs feel so much better with them on. He is agreeable to proceed with the neurosurgery referral. His questions were answered. I let him know I would confirm if the 12/2 EMG/NCS is still needed or not and I would call him back tomorrow. He verbalized appreciation.

## 2020-11-15 NOTE — Telephone Encounter (Signed)
-----   Message from Melvenia Beam, MD sent at 11/14/2020  2:42 PM EST ----- Patient has severe spinal stenosis, he will need to be seen by a surgeon. Please discuss he should be seen by neurosurgery and may need surgical interb=vention. If he agrees we can refer to [place] [facility] thanks

## 2020-11-16 ENCOUNTER — Other Ambulatory Visit: Payer: Self-pay | Admitting: Neurology

## 2020-11-16 DIAGNOSIS — M48062 Spinal stenosis, lumbar region with neurogenic claudication: Secondary | ICD-10-CM

## 2020-11-16 NOTE — Telephone Encounter (Signed)
Referral sent thanks

## 2020-11-16 NOTE — Telephone Encounter (Signed)
Spoke with patient and let him know that Dr Jaynee Eagles advises to cancel the EMG/NCS as the problem is surgical. He verbalized understanding and appreciation for the call. His questions were answered. Pt aware the referral is being sent to [facility] and they will call him to schedule the consult.

## 2020-11-16 NOTE — Telephone Encounter (Signed)
No please cancel, this is surgical. thanks

## 2020-11-16 NOTE — Telephone Encounter (Signed)
12/2 EMG/NCS canceled.

## 2020-11-22 DIAGNOSIS — R200 Anesthesia of skin: Secondary | ICD-10-CM | POA: Diagnosis not present

## 2020-11-22 DIAGNOSIS — I10 Essential (primary) hypertension: Secondary | ICD-10-CM | POA: Diagnosis not present

## 2020-11-22 DIAGNOSIS — M48062 Spinal stenosis, lumbar region with neurogenic claudication: Secondary | ICD-10-CM | POA: Diagnosis not present

## 2020-11-22 DIAGNOSIS — R202 Paresthesia of skin: Secondary | ICD-10-CM | POA: Diagnosis not present

## 2020-11-25 ENCOUNTER — Encounter: Payer: Medicare PPO | Admitting: Neurology

## 2020-11-29 ENCOUNTER — Telehealth: Payer: Self-pay | Admitting: Neurology

## 2020-11-29 ENCOUNTER — Telehealth: Payer: Self-pay | Admitting: *Deleted

## 2020-11-29 NOTE — Telephone Encounter (Signed)
error 

## 2020-11-29 NOTE — Telephone Encounter (Signed)
Oh great, thanks! Dr Jaynee Eagles, Juluis Rainier

## 2020-11-29 NOTE — Telephone Encounter (Signed)
We received a consult note from [place] who recommends patient proceed with EMG/NCS. Per Dr Jaynee Eagles, please call patient and schedule for next available EMG/NCS with her.

## 2020-12-02 DIAGNOSIS — M48062 Spinal stenosis, lumbar region with neurogenic claudication: Secondary | ICD-10-CM | POA: Diagnosis not present

## 2020-12-02 DIAGNOSIS — R200 Anesthesia of skin: Secondary | ICD-10-CM | POA: Diagnosis not present

## 2020-12-13 ENCOUNTER — Ambulatory Visit (INDEPENDENT_AMBULATORY_CARE_PROVIDER_SITE_OTHER): Payer: Medicare PPO

## 2020-12-13 DIAGNOSIS — I639 Cerebral infarction, unspecified: Secondary | ICD-10-CM

## 2020-12-13 LAB — CUP PACEART REMOTE DEVICE CHECK
Date Time Interrogation Session: 20211218231313
Implantable Pulse Generator Implant Date: 20200323

## 2020-12-27 NOTE — Progress Notes (Signed)
Carelink Summary Report / Loop Recorder 

## 2021-01-15 LAB — CUP PACEART REMOTE DEVICE CHECK
Date Time Interrogation Session: 20220121005344
Implantable Pulse Generator Implant Date: 20200323

## 2021-01-17 ENCOUNTER — Ambulatory Visit (INDEPENDENT_AMBULATORY_CARE_PROVIDER_SITE_OTHER): Payer: Medicare PPO

## 2021-01-17 DIAGNOSIS — I639 Cerebral infarction, unspecified: Secondary | ICD-10-CM | POA: Diagnosis not present

## 2021-01-28 NOTE — Progress Notes (Signed)
Carelink Summary Report / Loop Recorder 

## 2021-02-19 LAB — CUP PACEART REMOTE DEVICE CHECK
Date Time Interrogation Session: 20220223021058
Implantable Pulse Generator Implant Date: 20200323

## 2021-02-21 ENCOUNTER — Ambulatory Visit (INDEPENDENT_AMBULATORY_CARE_PROVIDER_SITE_OTHER): Payer: Medicare PPO

## 2021-02-21 DIAGNOSIS — I639 Cerebral infarction, unspecified: Secondary | ICD-10-CM | POA: Diagnosis not present

## 2021-02-22 DIAGNOSIS — D485 Neoplasm of uncertain behavior of skin: Secondary | ICD-10-CM | POA: Diagnosis not present

## 2021-02-25 DIAGNOSIS — Z8546 Personal history of malignant neoplasm of prostate: Secondary | ICD-10-CM | POA: Diagnosis not present

## 2021-03-01 DIAGNOSIS — C4441 Basal cell carcinoma of skin of scalp and neck: Secondary | ICD-10-CM | POA: Diagnosis not present

## 2021-03-01 NOTE — Progress Notes (Signed)
Carelink Summary Report / Loop Recorder 

## 2021-03-04 DIAGNOSIS — Z8546 Personal history of malignant neoplasm of prostate: Secondary | ICD-10-CM | POA: Diagnosis not present

## 2021-03-04 DIAGNOSIS — R300 Dysuria: Secondary | ICD-10-CM | POA: Diagnosis not present

## 2021-03-10 DIAGNOSIS — Z1331 Encounter for screening for depression: Secondary | ICD-10-CM | POA: Diagnosis not present

## 2021-03-10 DIAGNOSIS — Z9181 History of falling: Secondary | ICD-10-CM | POA: Diagnosis not present

## 2021-03-10 DIAGNOSIS — E785 Hyperlipidemia, unspecified: Secondary | ICD-10-CM | POA: Diagnosis not present

## 2021-03-10 DIAGNOSIS — Z0000 Encounter for general adult medical examination without abnormal findings: Secondary | ICD-10-CM | POA: Diagnosis not present

## 2021-03-21 ENCOUNTER — Ambulatory Visit (INDEPENDENT_AMBULATORY_CARE_PROVIDER_SITE_OTHER): Payer: Medicare PPO

## 2021-03-21 DIAGNOSIS — I639 Cerebral infarction, unspecified: Secondary | ICD-10-CM

## 2021-03-21 LAB — CUP PACEART REMOTE DEVICE CHECK
Date Time Interrogation Session: 20220328031343
Implantable Pulse Generator Implant Date: 20200323

## 2021-04-01 NOTE — Progress Notes (Signed)
Carelink Summary Report / Loop Recorder 

## 2021-04-25 ENCOUNTER — Ambulatory Visit (INDEPENDENT_AMBULATORY_CARE_PROVIDER_SITE_OTHER): Payer: Medicare PPO

## 2021-04-25 ENCOUNTER — Other Ambulatory Visit: Payer: Self-pay | Admitting: Orthopaedic Surgery

## 2021-04-25 DIAGNOSIS — I639 Cerebral infarction, unspecified: Secondary | ICD-10-CM | POA: Diagnosis not present

## 2021-04-25 NOTE — Telephone Encounter (Signed)
Needs to get from his PCP, read my last OV note June 2021 thanks.

## 2021-04-26 LAB — CUP PACEART REMOTE DEVICE CHECK
Date Time Interrogation Session: 20220430031419
Implantable Pulse Generator Implant Date: 20200323

## 2021-05-10 DIAGNOSIS — M9903 Segmental and somatic dysfunction of lumbar region: Secondary | ICD-10-CM | POA: Diagnosis not present

## 2021-05-10 DIAGNOSIS — M4726 Other spondylosis with radiculopathy, lumbar region: Secondary | ICD-10-CM | POA: Diagnosis not present

## 2021-05-10 DIAGNOSIS — M9904 Segmental and somatic dysfunction of sacral region: Secondary | ICD-10-CM | POA: Diagnosis not present

## 2021-05-10 DIAGNOSIS — M9901 Segmental and somatic dysfunction of cervical region: Secondary | ICD-10-CM | POA: Diagnosis not present

## 2021-05-10 DIAGNOSIS — M4723 Other spondylosis with radiculopathy, cervicothoracic region: Secondary | ICD-10-CM | POA: Diagnosis not present

## 2021-05-10 DIAGNOSIS — M5388 Other specified dorsopathies, sacral and sacrococcygeal region: Secondary | ICD-10-CM | POA: Diagnosis not present

## 2021-05-13 DIAGNOSIS — M5388 Other specified dorsopathies, sacral and sacrococcygeal region: Secondary | ICD-10-CM | POA: Diagnosis not present

## 2021-05-13 DIAGNOSIS — M4723 Other spondylosis with radiculopathy, cervicothoracic region: Secondary | ICD-10-CM | POA: Diagnosis not present

## 2021-05-13 DIAGNOSIS — M9901 Segmental and somatic dysfunction of cervical region: Secondary | ICD-10-CM | POA: Diagnosis not present

## 2021-05-13 DIAGNOSIS — M9903 Segmental and somatic dysfunction of lumbar region: Secondary | ICD-10-CM | POA: Diagnosis not present

## 2021-05-13 DIAGNOSIS — M9904 Segmental and somatic dysfunction of sacral region: Secondary | ICD-10-CM | POA: Diagnosis not present

## 2021-05-13 DIAGNOSIS — M4726 Other spondylosis with radiculopathy, lumbar region: Secondary | ICD-10-CM | POA: Diagnosis not present

## 2021-05-13 NOTE — Progress Notes (Signed)
Carelink Summary Report / Loop Recorder 

## 2021-05-17 DIAGNOSIS — M9904 Segmental and somatic dysfunction of sacral region: Secondary | ICD-10-CM | POA: Diagnosis not present

## 2021-05-17 DIAGNOSIS — M9903 Segmental and somatic dysfunction of lumbar region: Secondary | ICD-10-CM | POA: Diagnosis not present

## 2021-05-17 DIAGNOSIS — M5388 Other specified dorsopathies, sacral and sacrococcygeal region: Secondary | ICD-10-CM | POA: Diagnosis not present

## 2021-05-17 DIAGNOSIS — M9901 Segmental and somatic dysfunction of cervical region: Secondary | ICD-10-CM | POA: Diagnosis not present

## 2021-05-17 DIAGNOSIS — M4723 Other spondylosis with radiculopathy, cervicothoracic region: Secondary | ICD-10-CM | POA: Diagnosis not present

## 2021-05-17 DIAGNOSIS — M4726 Other spondylosis with radiculopathy, lumbar region: Secondary | ICD-10-CM | POA: Diagnosis not present

## 2021-05-26 DIAGNOSIS — R7303 Prediabetes: Secondary | ICD-10-CM | POA: Diagnosis not present

## 2021-05-26 DIAGNOSIS — Z8673 Personal history of transient ischemic attack (TIA), and cerebral infarction without residual deficits: Secondary | ICD-10-CM | POA: Diagnosis not present

## 2021-05-26 DIAGNOSIS — Z6825 Body mass index (BMI) 25.0-25.9, adult: Secondary | ICD-10-CM | POA: Diagnosis not present

## 2021-05-26 DIAGNOSIS — E7800 Pure hypercholesterolemia, unspecified: Secondary | ICD-10-CM | POA: Diagnosis not present

## 2021-05-26 DIAGNOSIS — G629 Polyneuropathy, unspecified: Secondary | ICD-10-CM | POA: Diagnosis not present

## 2021-05-26 DIAGNOSIS — I10 Essential (primary) hypertension: Secondary | ICD-10-CM | POA: Diagnosis not present

## 2021-05-26 DIAGNOSIS — M2550 Pain in unspecified joint: Secondary | ICD-10-CM | POA: Diagnosis not present

## 2021-05-29 LAB — CUP PACEART REMOTE DEVICE CHECK
Date Time Interrogation Session: 20220602031552
Implantable Pulse Generator Implant Date: 20200323

## 2021-05-30 ENCOUNTER — Ambulatory Visit (INDEPENDENT_AMBULATORY_CARE_PROVIDER_SITE_OTHER): Payer: Medicare PPO

## 2021-05-30 DIAGNOSIS — I639 Cerebral infarction, unspecified: Secondary | ICD-10-CM | POA: Diagnosis not present

## 2021-06-01 DIAGNOSIS — M9903 Segmental and somatic dysfunction of lumbar region: Secondary | ICD-10-CM | POA: Diagnosis not present

## 2021-06-01 DIAGNOSIS — M5388 Other specified dorsopathies, sacral and sacrococcygeal region: Secondary | ICD-10-CM | POA: Diagnosis not present

## 2021-06-01 DIAGNOSIS — M9901 Segmental and somatic dysfunction of cervical region: Secondary | ICD-10-CM | POA: Diagnosis not present

## 2021-06-01 DIAGNOSIS — M9904 Segmental and somatic dysfunction of sacral region: Secondary | ICD-10-CM | POA: Diagnosis not present

## 2021-06-01 DIAGNOSIS — M4726 Other spondylosis with radiculopathy, lumbar region: Secondary | ICD-10-CM | POA: Diagnosis not present

## 2021-06-01 DIAGNOSIS — M4723 Other spondylosis with radiculopathy, cervicothoracic region: Secondary | ICD-10-CM | POA: Diagnosis not present

## 2021-06-15 DIAGNOSIS — M9903 Segmental and somatic dysfunction of lumbar region: Secondary | ICD-10-CM | POA: Diagnosis not present

## 2021-06-15 DIAGNOSIS — M9905 Segmental and somatic dysfunction of pelvic region: Secondary | ICD-10-CM | POA: Diagnosis not present

## 2021-06-15 DIAGNOSIS — M5432 Sciatica, left side: Secondary | ICD-10-CM | POA: Diagnosis not present

## 2021-06-15 DIAGNOSIS — M4726 Other spondylosis with radiculopathy, lumbar region: Secondary | ICD-10-CM | POA: Diagnosis not present

## 2021-06-15 DIAGNOSIS — M9901 Segmental and somatic dysfunction of cervical region: Secondary | ICD-10-CM | POA: Diagnosis not present

## 2021-06-15 DIAGNOSIS — M4722 Other spondylosis with radiculopathy, cervical region: Secondary | ICD-10-CM | POA: Diagnosis not present

## 2021-06-21 NOTE — Progress Notes (Signed)
Carelink Summary Report / Loop Recorder 

## 2021-07-03 LAB — CUP PACEART REMOTE DEVICE CHECK
Date Time Interrogation Session: 20220705031652
Implantable Pulse Generator Implant Date: 20200323

## 2021-07-04 ENCOUNTER — Ambulatory Visit (INDEPENDENT_AMBULATORY_CARE_PROVIDER_SITE_OTHER): Payer: Medicare PPO

## 2021-07-04 DIAGNOSIS — I639 Cerebral infarction, unspecified: Secondary | ICD-10-CM | POA: Diagnosis not present

## 2021-07-13 DIAGNOSIS — M5432 Sciatica, left side: Secondary | ICD-10-CM | POA: Diagnosis not present

## 2021-07-13 DIAGNOSIS — M9901 Segmental and somatic dysfunction of cervical region: Secondary | ICD-10-CM | POA: Diagnosis not present

## 2021-07-13 DIAGNOSIS — M9905 Segmental and somatic dysfunction of pelvic region: Secondary | ICD-10-CM | POA: Diagnosis not present

## 2021-07-13 DIAGNOSIS — M4726 Other spondylosis with radiculopathy, lumbar region: Secondary | ICD-10-CM | POA: Diagnosis not present

## 2021-07-13 DIAGNOSIS — M4722 Other spondylosis with radiculopathy, cervical region: Secondary | ICD-10-CM | POA: Diagnosis not present

## 2021-07-13 DIAGNOSIS — M9903 Segmental and somatic dysfunction of lumbar region: Secondary | ICD-10-CM | POA: Diagnosis not present

## 2021-07-27 NOTE — Progress Notes (Signed)
Carelink Summary Report / Loop Recorder 

## 2021-08-08 ENCOUNTER — Other Ambulatory Visit: Payer: Self-pay

## 2021-08-08 ENCOUNTER — Ambulatory Visit (INDEPENDENT_AMBULATORY_CARE_PROVIDER_SITE_OTHER): Payer: Medicare PPO

## 2021-08-08 DIAGNOSIS — I639 Cerebral infarction, unspecified: Secondary | ICD-10-CM | POA: Diagnosis not present

## 2021-08-08 LAB — CUP PACEART REMOTE DEVICE CHECK
Date Time Interrogation Session: 20220813000500
Implantable Pulse Generator Implant Date: 20200323

## 2021-08-08 MED ORDER — APIXABAN 5 MG PO TABS: 5.0000 mg | ORAL_TABLET | Freq: Two times a day (BID) | ORAL | 3 refills | Status: DC

## 2021-08-08 NOTE — Telephone Encounter (Signed)
The patient wife is requesting the nurse send the patient [facility] on [address] and not [address].  Her phone number is [phone] for the nurse to call with additional questions.

## 2021-08-08 NOTE — Telephone Encounter (Signed)
2 new AF episodes.  8/12, duration 3hrs, mean HR 167 1 tachy 8/12 duration 18mn 16sec, mean HR 240.  EGM slightly irregular rhythm likely RVR No Hx of AF noted, no OAC.  Reviewed with Dr. ARayann Hemanand agreeablee AF w/ RVR. Orders reviewed to STOP ASA and START ELIQUIS 5 mg BID. Follow up with AJonni Sangerthis week if possible. Will route to Dr. TLovena Leto review.   Patient reports he he took alpha lipoic acid and melatonin together and was able to tell his heart rate increased. He is not sure if he caused elevated heart rate, but he did experience palpitations. States he is having issues with neuropathy ~ reason for taking alpha lipoic acid. Unsure if this could be related but did want me to tell Dr. TLovena Le  Advised patient not to take the medications together until he hears back from Dr. TLovena Le

## 2021-08-10 DIAGNOSIS — M4726 Other spondylosis with radiculopathy, lumbar region: Secondary | ICD-10-CM | POA: Diagnosis not present

## 2021-08-10 DIAGNOSIS — M4725 Other spondylosis with radiculopathy, thoracolumbar region: Secondary | ICD-10-CM | POA: Diagnosis not present

## 2021-08-10 DIAGNOSIS — M9903 Segmental and somatic dysfunction of lumbar region: Secondary | ICD-10-CM | POA: Diagnosis not present

## 2021-08-10 DIAGNOSIS — M9905 Segmental and somatic dysfunction of pelvic region: Secondary | ICD-10-CM | POA: Diagnosis not present

## 2021-08-10 DIAGNOSIS — M9902 Segmental and somatic dysfunction of thoracic region: Secondary | ICD-10-CM | POA: Diagnosis not present

## 2021-08-10 DIAGNOSIS — M5432 Sciatica, left side: Secondary | ICD-10-CM | POA: Diagnosis not present

## 2021-08-15 NOTE — Progress Notes (Signed)
Electrophysiology Office Note Date: 08/16/2021  ID:  Brandon, Brown [birth year], MRN [id number]  PCP: Cyndi Bender, PA-C Primary Cardiologist: None Electrophysiologist: Cristopher Peru, MD   CC: ILR follow-up  Brandon Brown is a 85 y.o. male seen today for Dr. Lovena Le . he presents today for routine electrophysiology followup.  Since last being seen in our clinic, the patient reports doing very well. He states on 8/11 he went out for dinner with his wife to to celebrate his birthday. He had 3 beers, when he usually doesn't drink at all. He was noted to have new AF the next day and Eliquis was started. He presents today for follow up. Has questions about supplements Nervive and Curamin. he denies chest pain, palpitations,  dyspnea, PND, orthopnea, nausea, vomiting, dizziness, syncope, edema, weight gain, or early satiety.  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)    Skin cancer    per notes from [facility] at [facility]   Past Surgical History:  Procedure Laterality Date   bone spur left shoulder     EXCISION GANGLION CYST RIGHT ANKLE  11/15/2015   Dr Creig Hines; per notes from [facility] [place] Left    LEFT LONG FINGER (per notes from [facility])   [place] Bilateral    "left and right lower abdominal  hernia"   HYDROCELE EXCISION Left    INCISION / DRAINAGE HAND / FINGER     KNEE ARTHROSCOPY     LOOP RECORDER INSERTION N/A 03/17/2019   Procedure: LOOP RECORDER INSERTION;  Surgeon: Evans Lance, MD;  Location: [facility];  Service: Cardiovascular;  Laterality: N/A;   PROSTATE BIOPSY     ROTATOR CUFF REPAIR Left    per notes from [facility] [place]     tunica vaginalis excision of hydrocele right      Current Outpatient Medications  Medication Sig Dispense Refill   amLODipine (NORVASC) 5 MG tablet Take 5 mg by mouth daily.     apixaban (ELIQUIS) 5 MG TABS tablet Take 1 tablet (5 mg total) by mouth 2 (two) times daily. 60 tablet 3   ascorbic acid (VITAMIN C) 1000 MG tablet daily.     Coenzyme Q10 10 MG capsule daily.     lisinopril (PRINIVIL,ZESTRIL) 40 MG tablet Take 40 mg by mouth daily after breakfast.     MAGNESIUM MALATE PO Take 1,300 mg by mouth.     Melatonin 10 MG TABS as needed.     Meth-Hyo-M Bl-Na Phos-Ph Sal (URO-MP) 118 MG CAPS Take 1 capsule by mouth as needed.     omega-3 fish oil (MAXEPA) 1000 MG  CAPS capsule Take 1 capsule by mouth daily.     OVER THE COUNTER MEDICATION 1,000 mg daily. Beet root      OVER THE COUNTER MEDICATION Take by mouth as needed (pain). CURCUMIN     rosuvastatin (CRESTOR) 20 MG tablet Take 20 mg by mouth every other day.     vitamin B-12 (CYANOCOBALAMIN) 1000 MCG tablet Take 1,000 mcg by mouth at bedtime.     zinc gluconate 50 MG tablet Take 50 mg by mouth at bedtime.     No current facility-administered medications for this visit.    Allergies:   Penicillins and Statins   Social History: Social History   Socioeconomic History   Marital status: Married    Spouse name: Not on file   Number of children: Not on file   Years of education: Not on file   Highest education level: Not on file  Occupational History   Not on file   Tobacco Use   Smoking status: Never   Smokeless tobacco: Former    Types: Chew    Quit date: 9  Substance and Sexual Activity   Alcohol use: Yes    Comment: 1 beer every once in awhile    Drug use: No   Sexual activity: Never  Other Topics Concern   Not on file  Social History Narrative   Lives at home   Caffeine: never   Social Determinants of Radio broadcast assistant Strain: Not on file  Food Insecurity: Not on file  Transportation Needs: Not on file  Physical Activity: Not on file  Stress: Not on file  Social Connections: Not on file  Intimate Partner Violence: Not on file    Family History: Family History  Problem Relation Age of Onset   Heart disease Mother    Non-Hodgkin's lymphoma Mother    Hypertension Father        per notes from [facility] [place] Father        per notes from [facility] at [place] failure Father    Diabetes Brother        per notes from [facility] at [place] attack Other        paternal grandparent (per notes from [facility] [place])     Review of Systems: All other systems reviewed and are otherwise negative except as noted above.  Physical Exam: Vitals:   08/16/21 1041  BP: 130/68  Pulse: 87  SpO2: 96%  Weight: 175 lb (79.4 kg)  Height: 6' (1.829 m)     GEN- The patient is well appearing, alert and oriented x 3 today.   HEENT: normocephalic, atraumatic; sclera clear, conjunctiva pink; hearing intact; oropharynx clear; neck supple  Lungs- Clear to ausculation bilaterally, normal work of breathing.  No wheezes, rales, rhonchi Heart- Regular rate and rhythm, no murmurs, rubs or gallops  GI- soft, non-tender, non-distended, bowel sounds present  Extremities- no clubbing, cyanosis, or edema  MS- no significant deformity or atrophy Skin- warm and dry, no rash or lesion; PPM pocket well healed Psych-  euthymic mood, full affect Neuro- strength and sensation are intact  PPM Interrogation- reviewed in detail today,  See PACEART report  EKG:  EKG is not ordered today.  Recent Labs: No results found for requested labs within last 8760 hours.   Wt Readings from Last 3 Encounters:  08/16/21 175 lb (79.4 kg)  10/27/20 184 lb (83.5 kg)  04/23/20 189 lb (85.7 kg)     Other studies Reviewed: Additional studies/ records that were reviewed today include: Echo 02/2019 shows LVEF 55-60%, Previous EP office notes, Previous remote checks, Most recent labwork.   Assessment and Plan:  1. Cryptogenic Stroke s/p Medtronic Loop recorder Normal device function See Pace Art report No changes today  2. Paroxysmal atrial fibrillation  New diagnosis. Occurred after ETOH intake, which is not usual for him.  NSR today.  Continue eliquis 5 mg BID. Labs today.  Curamin is an anti-inflammatory and I recommended he NOT take this along with Eliquis. Recommend he try capsaicin > follow up with PCP for further recommendations given his neuropathy Avoid ETOH which is clear trigger for him  Be careful with heavy weight lifting given new start [place].  3. Neuropathy 2/2 to spinal stenosis Recommended he try capsaicin topical in lieu of curamin (anti-inflammatory supplement) which may increase breathing I cannot find any interactions between [facility] and Nervive (vitamin B and alpha lipoic)  Current medicines are reviewed at length with the patient today.    Labs/ tests ordered today include:  Orders Placed This Encounter  Procedures   Basic metabolic panel   CBC    Disposition:   Follow up with Dr. Lovena Le in 3-4 Months with new AF and new start [place].     Jacalyn Lefevre, PA-C  08/16/2021 10:49 AM  [facility] [address] [place] [address] [phone] (office) [phone] (fax)

## 2021-08-16 ENCOUNTER — Other Ambulatory Visit: Payer: Self-pay

## 2021-08-16 ENCOUNTER — Encounter: Payer: Self-pay | Admitting: Student

## 2021-08-16 ENCOUNTER — Ambulatory Visit (INDEPENDENT_AMBULATORY_CARE_PROVIDER_SITE_OTHER): Payer: Medicare PPO | Admitting: Student

## 2021-08-16 VITALS — BP 130/68 | HR 87 | Ht 72.0 in | Wt 175.0 lb

## 2021-08-16 DIAGNOSIS — I480 Paroxysmal atrial fibrillation: Secondary | ICD-10-CM | POA: Diagnosis not present

## 2021-08-16 DIAGNOSIS — I639 Cerebral infarction, unspecified: Secondary | ICD-10-CM | POA: Diagnosis not present

## 2021-08-16 LAB — CUP PACEART INCLINIC DEVICE CHECK
Date Time Interrogation Session: 20220823112449
Implantable Pulse Generator Implant Date: 20200323

## 2021-08-16 NOTE — Patient Instructions (Signed)
Medication Instructions:  Your physician recommends that you continue on your current medications as directed. Please refer to the Current Medication list given to you today.  *If you need a refill on your cardiac medications before your next appointment, please call your pharmacy*   Lab Work: TODAY: BMET, CBC  If you have labs (blood work) drawn today and your tests are completely normal, you will receive your results only by: [place] (if you have MyChart) OR A paper copy in the mail If you have any lab test that is abnormal or we need to change your treatment, we will call you to review the results.   Follow-Up: At [facility], you and your health needs are our priority.  As part of our continuing mission to provide you with exceptional heart care, we have created designated Provider Care Teams.  These Care Teams include your primary Cardiologist (physician) and Advanced Practice Providers (APPs -  Physician Assistants and Nurse Practitioners) who all work together to provide you with the care you need, when you need it.  We recommend signing up for the patient portal called "MyChart".  Sign up information is provided on this After Visit Summary.  MyChart is used to connect with patients for Virtual Visits (Telemedicine).  Patients are able to view lab/test results, encounter notes, upcoming appointments, etc.  Non-urgent messages can be sent to your provider as well.   To learn more about what you can do with MyChart, go to [email or web address].    Your next appointment:   3-4 month(s)  The format for your next appointment:   In Person  Provider:   Cristopher Peru, MD

## 2021-08-17 LAB — CBC
Hematocrit: 43.1 % (ref 37.5–51.0)
Hemoglobin: 14.8 g/dL (ref 13.0–17.7)
MCH: 33.2 pg — ABNORMAL HIGH (ref 26.6–33.0)
MCHC: 34.3 g/dL (ref 31.5–35.7)
MCV: 97 fL (ref 79–97)
Platelets: 177 10*3/uL (ref 150–450)
RBC: 4.46 x10E6/uL (ref 4.14–5.80)
RDW: 12.1 % (ref 11.6–15.4)
WBC: 4.7 10*3/uL (ref 3.4–10.8)

## 2021-08-17 LAB — BASIC METABOLIC PANEL
BUN/Creatinine Ratio: 20 (ref 10–24)
BUN: 16 mg/dL (ref 8–27)
CO2: 20 mmol/L (ref 20–29)
Calcium: 9.5 mg/dL (ref 8.6–10.2)
Chloride: 97 mmol/L (ref 96–106)
Creatinine, Ser: 0.82 mg/dL (ref 0.76–1.27)
Glucose: 172 mg/dL — ABNORMAL HIGH (ref 65–99)
Potassium: 4.4 mmol/L (ref 3.5–5.2)
Sodium: 134 mmol/L (ref 134–144)
eGFR: 87 mL/min/{1.73_m2} (ref >59)

## 2021-08-26 NOTE — Progress Notes (Signed)
Carelink Summary Report / Loop Recorder 

## 2021-09-12 ENCOUNTER — Ambulatory Visit (INDEPENDENT_AMBULATORY_CARE_PROVIDER_SITE_OTHER): Payer: Medicare PPO

## 2021-09-12 DIAGNOSIS — I639 Cerebral infarction, unspecified: Secondary | ICD-10-CM

## 2021-09-13 LAB — CUP PACEART REMOTE DEVICE CHECK
Date Time Interrogation Session: 20220916233402
Implantable Pulse Generator Implant Date: 20200323

## 2021-09-16 NOTE — Progress Notes (Signed)
Carelink Summary Report / Loop Recorder 

## 2021-10-17 ENCOUNTER — Ambulatory Visit (INDEPENDENT_AMBULATORY_CARE_PROVIDER_SITE_OTHER): Payer: Medicare PPO

## 2021-10-17 DIAGNOSIS — I639 Cerebral infarction, unspecified: Secondary | ICD-10-CM

## 2021-10-17 LAB — CUP PACEART REMOTE DEVICE CHECK
Date Time Interrogation Session: 20221019233814
Implantable Pulse Generator Implant Date: 20200323

## 2021-10-25 NOTE — Progress Notes (Signed)
Carelink Summary Report / Loop Recorder 

## 2021-11-04 ENCOUNTER — Other Ambulatory Visit: Payer: Self-pay | Admitting: Internal Medicine

## 2021-11-04 DIAGNOSIS — I4891 Unspecified atrial fibrillation: Secondary | ICD-10-CM

## 2021-11-04 DIAGNOSIS — I639 Cerebral infarction, unspecified: Secondary | ICD-10-CM

## 2021-11-07 NOTE — Telephone Encounter (Signed)
Eliquis 5mg  refill request received. Patient is 85 years old, weight-79.4kg, Crea-0.82 on 08/16/2021, Diagnosis-Afib & Stroke, and last seen by Oda Kilts PA on 08/16/2021. Dose is appropriate based on dosing criteria. Will send in refill to requested pharmacy.

## 2021-11-09 ENCOUNTER — Other Ambulatory Visit: Payer: Self-pay | Admitting: Internal Medicine

## 2021-11-09 DIAGNOSIS — I4891 Unspecified atrial fibrillation: Secondary | ICD-10-CM

## 2021-11-09 DIAGNOSIS — I639 Cerebral infarction, unspecified: Secondary | ICD-10-CM

## 2021-11-09 NOTE — Telephone Encounter (Signed)
Prescription refill request for Eliquis received. Indication: Afib  Last office visit:08/16/21 ([place])  Scr: 0.82 (08/16/21)  Age: 85 Weight: 79.4kg  Appropriate dose and refill sent to requested pharmacy.

## 2021-11-21 ENCOUNTER — Ambulatory Visit (INDEPENDENT_AMBULATORY_CARE_PROVIDER_SITE_OTHER): Payer: Medicare PPO

## 2021-11-21 DIAGNOSIS — I639 Cerebral infarction, unspecified: Secondary | ICD-10-CM

## 2021-11-22 ENCOUNTER — Telehealth: Payer: Self-pay

## 2021-11-22 LAB — CUP PACEART REMOTE DEVICE CHECK
Date Time Interrogation Session: 20221121223830
Implantable Pulse Generator Implant Date: 20200323

## 2021-11-22 NOTE — Telephone Encounter (Signed)
The patient has been notified of the result and verbalized understanding.  All questions (if any) were answered. Omari Mcmanaway, [facility] 11/22/2021 2:05 PM

## 2021-11-22 NOTE — Telephone Encounter (Signed)
Patient called in and would like a call from nurse about his blood work he got done 08/16/2021. Patient states he never heard back about anything

## 2021-11-29 NOTE — Progress Notes (Signed)
Carelink Summary Report / Loop Recorder 

## 2021-11-30 DIAGNOSIS — G629 Polyneuropathy, unspecified: Secondary | ICD-10-CM | POA: Diagnosis not present

## 2021-11-30 DIAGNOSIS — Z6825 Body mass index (BMI) 25.0-25.9, adult: Secondary | ICD-10-CM | POA: Diagnosis not present

## 2021-11-30 DIAGNOSIS — Z139 Encounter for screening, unspecified: Secondary | ICD-10-CM | POA: Diagnosis not present

## 2021-11-30 DIAGNOSIS — Z8673 Personal history of transient ischemic attack (TIA), and cerebral infarction without residual deficits: Secondary | ICD-10-CM | POA: Diagnosis not present

## 2021-11-30 DIAGNOSIS — I4891 Unspecified atrial fibrillation: Secondary | ICD-10-CM | POA: Diagnosis not present

## 2021-11-30 DIAGNOSIS — R7303 Prediabetes: Secondary | ICD-10-CM | POA: Diagnosis not present

## 2021-11-30 DIAGNOSIS — I10 Essential (primary) hypertension: Secondary | ICD-10-CM | POA: Diagnosis not present

## 2021-11-30 DIAGNOSIS — E7800 Pure hypercholesterolemia, unspecified: Secondary | ICD-10-CM | POA: Diagnosis not present

## 2021-12-12 ENCOUNTER — Other Ambulatory Visit: Payer: Self-pay

## 2021-12-12 ENCOUNTER — Encounter: Payer: Self-pay | Admitting: Internal Medicine

## 2021-12-12 ENCOUNTER — Ambulatory Visit: Payer: Medicare PPO | Admitting: Internal Medicine

## 2021-12-12 VITALS — BP 124/70 | HR 62 | Ht 71.5 in | Wt 175.2 lb

## 2021-12-12 DIAGNOSIS — I639 Cerebral infarction, unspecified: Secondary | ICD-10-CM

## 2021-12-12 DIAGNOSIS — I480 Paroxysmal atrial fibrillation: Secondary | ICD-10-CM | POA: Diagnosis not present

## 2021-12-12 NOTE — Patient Instructions (Signed)
Medication Instructions:  Your physician recommends that you continue on your current medications as directed. Please refer to the Current Medication list given to you today.  Labwork: None ordered.  Testing/Procedures: None ordered.  Follow-Up: Your physician wants you to follow-up in: one year with Cristopher Peru, MD or one of the following Advanced Practice Providers on your designated Care Team:   Tommye Standard, [place] Legrand Como "Jonni Sanger" Chalmers Cater, [place]  Remote monitoring is used to monitor your loop recorder from home.  Any Other Special Instructions Will Be Listed Below (If Applicable).  If you need a refill on your cardiac medications before your next appointment, please call your pharmacy.

## 2021-12-12 NOTE — Progress Notes (Signed)
HPI [name] returns today for ongoing evaluation and management.  He is a very pleasant 85 year old man who had a history of cryptogenic stroke and status post implantable loop recorder.  The patient was found to have atrial fibrillation and was started on Eliquis.  He returns today for additional evaluation.  He denies chest pain or shortness of breath.  No recurrent neurological events. Allergies  Allergen Reactions   Penicillins Other (See Comments)    Unknown Did it involve swelling of the face/tongue/throat, SOB, or low BP? Unknown Did it involve sudden or severe rash/hives, skin peeling, or any reaction on the inside of your mouth or nose? Unknown Did you need to seek medical attention at a hospital or doctor's office? Unknown When did it last happen? Unknown  If all above answers are NO, may proceed with cephalosporin use.    Statins Other (See Comments)    Joints, muscles ache     Current Outpatient Medications  Medication Sig Dispense Refill   amLODipine (NORVASC) 5 MG tablet Take 5 mg by mouth daily.     ascorbic acid (VITAMIN C) 1000 MG tablet daily.     Coenzyme Q10 10 MG capsule daily.     ELIQUIS 5 MG TABS tablet TAKE 1 TABLET BY MOUTH TWICE A DAY 60 tablet 5   ezetimibe (ZETIA) 10 MG tablet Take 10 mg by mouth daily.     lisinopril (PRINIVIL,ZESTRIL) 40 MG tablet Take 40 mg by mouth daily after breakfast.     MAGNESIUM MALATE PO Take 1,300 mg by mouth.     Melatonin 10 MG TABS as needed.     Multiple Vitamins-Minerals (PRESERVISION AREDS PO) Take 2 tablets by mouth daily.     omega-3 fish oil (MAXEPA) 1000 MG CAPS capsule Take 1 capsule by mouth daily.     OVER THE COUNTER MEDICATION Take by mouth as needed (pain). CURCUMIN     OVER THE COUNTER MEDICATION Take 1 tablet by mouth daily. Medication Name: Theotis Burrow     vitamin B-12 (CYANOCOBALAMIN) 1000 MCG tablet Take 1,000 mcg by mouth at bedtime.     Meth-Hyo-M Bl-Na Phos-Ph Sal (URO-MP) 118 MG CAPS Take 1  capsule by mouth as needed. (Patient not taking: Reported on 12/12/2021)     OVER THE COUNTER MEDICATION 1,000 mg daily. Beet root  (Patient not taking: Reported on 12/12/2021)     rosuvastatin (CRESTOR) 20 MG tablet Take 20 mg by mouth every other day. (Patient not taking: Reported on 12/12/2021)     zinc gluconate 50 MG tablet Take 50 mg by mouth at bedtime. (Patient not taking: Reported on 12/12/2021)     No current facility-administered medications for this visit.     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   Arthritis    [facility] from [facility]   Dysphagia due to old stroke    per notes from [facility] [place]   Hx of prostatitis    Hyperlipidemia    Hypertension    Peripheral neuropathy    per notes from [facility] [place]   Prediabetes    per notes from [facility] [place] [facility])    Skin cancer    per notes from [facility] [place]:   All systems reviewed  and negative except as noted in the HPI.   Past Surgical History:  Procedure Laterality Date   bone spur left shoulder     EXCISION GANGLION CYST RIGHT ANKLE  11/15/2015   Dr Creig Hines; per notes from [facility] at [place] Left    LEFT LONG FINGER (per notes from [facility])   HERNIA REPAIR Bilateral    "left and right lower abdominal hernia"   HYDROCELE EXCISION Left    INCISION / DRAINAGE HAND / FINGER     KNEE ARTHROSCOPY     LOOP RECORDER INSERTION N/A 03/17/2019   Procedure: LOOP RECORDER INSERTION;  Surgeon: Evans Lance, MD;  Location: [facility];  Service: Cardiovascular;  Laterality: N/A;   PROSTATE BIOPSY     ROTATOR CUFF REPAIR Left    per notes from [facility] at [place]     tunica vaginalis excision of hydrocele right       Family History  Problem Relation Age of Onset   Heart disease Mother    Non-Hodgkin's lymphoma Mother    Hypertension Father        per notes from [facility] at [place] Father        per notes from [facility] [place] failure Father    Diabetes Brother        per notes from [facility] [place] attack Other        paternal grandparent (per notes from [facility] [place])     Social History   Socioeconomic History   Marital status: Married    Spouse name: Not on file   Number of children: Not on file   Years of education: Not on file   Highest education level: Not on file  Occupational History   Not on file  Tobacco Use   Smoking status: Never   Smokeless tobacco: Former    Types: Loss adjuster, chartered    Quit date: 1992  Substance and Sexual Activity   Alcohol use: Yes    Comment: 1 beer every once in awhile    Drug use: No   Sexual activity: Never  Other Topics Concern   Not on file  Social History Narrative   Lives at home   Caffeine: never   Social Determinants of Radio broadcast assistant Strain: Not on file  Food Insecurity: Not on file  Transportation Needs: Not on file  Physical Activity: Not on file  Stress: Not on file  Social Connections: Not on file  Intimate Partner Violence: Not on file     BP 124/70    Pulse 62    Ht 5' 11.5" (1.816 m)    Wt 175 lb 3.2 oz (79.5 kg)    SpO2 96%    BMI 24.09 kg/m   Physical Exam:  Well appearing NAD HEENT: Unremarkable Neck:  No JVD, no thyromegally Lymphatics:  No adenopathy Back:  No CVA tenderness Lungs:  Clear, with no wheezes, rales, or rhonchi HEART:  Regular rate rhythm, no murmurs, no rubs, no clicks Abd:  soft, positive bowel sounds, no  organomegally, no rebound, no guarding Ext:  2 plus pulses, no edema, no cyanosis, no clubbing Skin:  No  rashes no nodules Neuro:  CN II through XII intact, motor grossly intact  EKG -normal sinus rhythm with IVCD  DEVICE  Normal device function.  See PaceArt for details.  Paroxysmal atrial fibrillation  Assess/Plan:  1.  Cryptogenic stroke -he has subsequently been found to have atrial fibrillation and will continue systemic anticoagulation 2.  Paroxysmal atrial fibrillation -he is minimally if at all symptomatic.  No change in his medical therapy for now.  If he were to develop persistent atrial fibrillation, then we would consider either antiarrhythmic or rate control. 3.  Coags -he has not had any bleeding on systemic anticoagulation. 4.  Dyslipidemia -he will continue statin therapy.  Lovena Le, MD

## 2021-12-20 ENCOUNTER — Other Ambulatory Visit (INDEPENDENT_AMBULATORY_CARE_PROVIDER_SITE_OTHER): Payer: Medicare PPO | Admitting: *Deleted

## 2021-12-20 ENCOUNTER — Ambulatory Visit (INDEPENDENT_AMBULATORY_CARE_PROVIDER_SITE_OTHER): Payer: Medicare PPO

## 2021-12-20 DIAGNOSIS — I639 Cerebral infarction, unspecified: Secondary | ICD-10-CM

## 2021-12-20 DIAGNOSIS — I480 Paroxysmal atrial fibrillation: Secondary | ICD-10-CM | POA: Diagnosis not present

## 2021-12-20 LAB — CUP PACEART REMOTE DEVICE CHECK
Date Time Interrogation Session: 20221224223946
Implantable Pulse Generator Implant Date: 20200323

## 2021-12-29 NOTE — Progress Notes (Signed)
Carelink Summary Report / Loop Recorder 

## 2022-01-30 ENCOUNTER — Ambulatory Visit (INDEPENDENT_AMBULATORY_CARE_PROVIDER_SITE_OTHER): Payer: Medicare PPO

## 2022-01-30 DIAGNOSIS — I639 Cerebral infarction, unspecified: Secondary | ICD-10-CM

## 2022-01-30 LAB — CUP PACEART REMOTE DEVICE CHECK
Date Time Interrogation Session: 20230205230738
Implantable Pulse Generator Implant Date: 20200323

## 2022-02-02 NOTE — Progress Notes (Signed)
Carelink Summary Report / Loop Recorder 

## 2022-02-17 DIAGNOSIS — D225 Melanocytic nevi of trunk: Secondary | ICD-10-CM | POA: Diagnosis not present

## 2022-02-17 DIAGNOSIS — L821 Other seborrheic keratosis: Secondary | ICD-10-CM | POA: Diagnosis not present

## 2022-02-17 DIAGNOSIS — L570 Actinic keratosis: Secondary | ICD-10-CM | POA: Diagnosis not present

## 2022-03-06 ENCOUNTER — Ambulatory Visit (INDEPENDENT_AMBULATORY_CARE_PROVIDER_SITE_OTHER): Payer: Medicare PPO

## 2022-03-06 DIAGNOSIS — I639 Cerebral infarction, unspecified: Secondary | ICD-10-CM

## 2022-03-06 LAB — CUP PACEART REMOTE DEVICE CHECK
Date Time Interrogation Session: 20230312231944
Implantable Pulse Generator Implant Date: 20200323

## 2022-03-13 DIAGNOSIS — Z1331 Encounter for screening for depression: Secondary | ICD-10-CM | POA: Diagnosis not present

## 2022-03-13 DIAGNOSIS — Z9181 History of falling: Secondary | ICD-10-CM | POA: Diagnosis not present

## 2022-03-13 DIAGNOSIS — Z0000 Encounter for general adult medical examination without abnormal findings: Secondary | ICD-10-CM | POA: Diagnosis not present

## 2022-03-17 NOTE — Progress Notes (Signed)
Carelink Summary Report / Loop Recorder 

## 2022-04-10 ENCOUNTER — Ambulatory Visit (INDEPENDENT_AMBULATORY_CARE_PROVIDER_SITE_OTHER): Payer: Medicare PPO

## 2022-04-10 DIAGNOSIS — I639 Cerebral infarction, unspecified: Secondary | ICD-10-CM

## 2022-04-10 DIAGNOSIS — A059 Bacterial foodborne intoxication, unspecified: Secondary | ICD-10-CM | POA: Diagnosis not present

## 2022-04-10 LAB — CUP PACEART REMOTE DEVICE CHECK
Date Time Interrogation Session: 20230414231243
Implantable Pulse Generator Implant Date: 20200323

## 2022-04-12 ENCOUNTER — Other Ambulatory Visit: Payer: Self-pay

## 2022-04-12 ENCOUNTER — Emergency Department (HOSPITAL_COMMUNITY): Payer: Medicare PPO

## 2022-04-12 ENCOUNTER — Encounter (HOSPITAL_COMMUNITY): Payer: Self-pay | Admitting: Emergency Medicine

## 2022-04-12 ENCOUNTER — Emergency Department (HOSPITAL_COMMUNITY)
Admission: EM | Admit: 2022-04-12 | Discharge: 2022-04-12 | Disposition: A | Discharge status: Home / Self Care | Payer: Medicare PPO | Attending: Emergency Medicine | Admitting: Emergency Medicine

## 2022-04-12 DIAGNOSIS — Z7901 Long term (current) use of anticoagulants: Secondary | ICD-10-CM | POA: Diagnosis not present

## 2022-04-12 DIAGNOSIS — K5792 Diverticulitis of intestine, part unspecified, without perforation or abscess without bleeding: Secondary | ICD-10-CM | POA: Diagnosis not present

## 2022-04-12 DIAGNOSIS — I700 Atherosclerosis of aorta: Secondary | ICD-10-CM | POA: Diagnosis not present

## 2022-04-12 DIAGNOSIS — R109 Unspecified abdominal pain: Secondary | ICD-10-CM | POA: Diagnosis present

## 2022-04-12 DIAGNOSIS — K5732 Diverticulitis of large intestine without perforation or abscess without bleeding: Secondary | ICD-10-CM

## 2022-04-12 DIAGNOSIS — K6389 Other specified diseases of intestine: Secondary | ICD-10-CM | POA: Diagnosis not present

## 2022-04-12 LAB — COMPREHENSIVE METABOLIC PANEL
ALT: 15 U/L (ref 0–44)
AST: 20 U/L (ref 15–41)
Albumin: 3.6 g/dL (ref 3.5–5.0)
Alkaline Phosphatase: 48 U/L (ref 38–126)
Anion gap: 8 (ref 5–15)
BUN: 16 mg/dL (ref 8–23)
CO2: 26 mmol/L (ref 22–32)
Calcium: 8.6 mg/dL — ABNORMAL LOW (ref 8.9–10.3)
Chloride: 98 mmol/L (ref 98–111)
Creatinine, Ser: 0.75 mg/dL (ref 0.61–1.24)
GFR, Estimated: >60 mL/min (ref >60)
Glucose, Bld: 150 mg/dL — ABNORMAL HIGH (ref 70–99)
Potassium: 3.4 mmol/L — ABNORMAL LOW (ref 3.5–5.1)
Sodium: 132 mmol/L — ABNORMAL LOW (ref 135–145)
Total Bilirubin: 1.4 mg/dL — ABNORMAL HIGH (ref 0.3–1.2)
Total Protein: 6.8 g/dL (ref 6.5–8.1)

## 2022-04-12 LAB — URINALYSIS, ROUTINE W REFLEX MICROSCOPIC
Bilirubin Urine: NEGATIVE
Glucose, UA: NEGATIVE mg/dL
Hgb urine dipstick: NEGATIVE
Ketones, ur: 5 mg/dL — AB
Leukocytes,Ua: NEGATIVE
Nitrite: NEGATIVE
Protein, ur: NEGATIVE mg/dL
Specific Gravity, Urine: 1.024 (ref 1.005–1.030)
pH: 5 (ref 5.0–8.0)

## 2022-04-12 LAB — CBC
HCT: 38.2 % — ABNORMAL LOW (ref 39.0–52.0)
Hemoglobin: 13 g/dL (ref 13.0–17.0)
MCH: 33.2 pg (ref 26.0–34.0)
MCHC: 34 g/dL (ref 30.0–36.0)
MCV: 97.7 fL (ref 80.0–100.0)
Platelets: 197 10*3/uL (ref 150–400)
RBC: 3.91 MIL/uL — ABNORMAL LOW (ref 4.22–5.81)
RDW: 12 % (ref 11.5–15.5)
WBC: 8 10*3/uL (ref 4.0–10.5)
nRBC: 0 % (ref 0.0–0.2)

## 2022-04-12 LAB — LIPASE, BLOOD: Lipase: 40 U/L (ref 11–51)

## 2022-04-12 MED ORDER — CEFDINIR 300 MG PO CAPS: 300.0000 mg | ORAL_CAPSULE | Freq: Two times a day (BID) | ORAL | 0 refills | Status: DC

## 2022-04-12 MED ORDER — SODIUM CHLORIDE 0.9 % IV BOLUS
1000.0000 mL | Freq: Once | INTRAVENOUS | Status: AC
  Administered 2022-04-12: 1000 mL via INTRAVENOUS

## 2022-04-12 MED ORDER — IOHEXOL 300 MG/ML  SOLN
100.0000 mL | Freq: Once | INTRAMUSCULAR | Status: AC | PRN
  Administered 2022-04-12: 100 mL via INTRAVENOUS

## 2022-04-12 MED ORDER — METRONIDAZOLE 500 MG PO TABS: 500.0000 mg | ORAL_TABLET | Freq: Two times a day (BID) | ORAL | 0 refills | Status: DC

## 2022-04-12 MED ORDER — SODIUM CHLORIDE 0.9 % IV SOLN
1.0000 g | Freq: Once | INTRAVENOUS | Status: AC
  Administered 2022-04-12: 1 g via INTRAVENOUS
  Filled 2022-04-12: qty 10

## 2022-04-12 MED ORDER — ONDANSETRON HCL 4 MG/2ML IJ SOLN
4.0000 mg | Freq: Once | INTRAMUSCULAR | Status: AC
  Administered 2022-04-12: 4 mg via INTRAVENOUS
  Filled 2022-04-12: qty 2

## 2022-04-12 MED ORDER — MORPHINE SULFATE (PF) 4 MG/ML IV SOLN
4.0000 mg | Freq: Once | INTRAVENOUS | Status: AC
  Administered 2022-04-12: 4 mg via INTRAVENOUS
  Filled 2022-04-12: qty 1

## 2022-04-12 NOTE — ED Provider Notes (Signed)
?[facility] DEPT ?Provider Note ? ? ?CSN: [id number] ?Arrival date & time: 04/12/22  1213 ? ?  ? ?History ? ?Chief Complaint  ?Patient presents with  ? Abdominal Pain  ? ? ?Brandon Brown is a 86 y.o. male here presenting with abdominal pain.  Patient states that he went to his great grandkids party on Saturday.  He states that he did eat some fried food.  He states that he also tripped in the playground and twisted his pelvis area.  Denies falling onto his pelvis.  Patient did take Gas-X but still feels crampy.  He states that one of the kids also has a stomach virus.  He feels nauseated but has no vomiting.  ? ?The history is provided by the patient.  ? ?  ? ?Home Medications ?Prior to Admission medications   ?Medication Sig Start Date End Date Taking? Authorizing Provider  ?amLODipine (NORVASC) 5 MG tablet Take 5 mg by mouth daily.    [provider]  ?ascorbic acid (VITAMIN C) 1000 MG tablet daily.    [provider]  ?Coenzyme Q10 10 MG capsule daily.    [provider]  ?ELIQUIS 5 MG TABS tablet TAKE 1 TABLET BY MOUTH TWICE A DAY 11/09/21   Evans Lance, MD  ?ezetimibe (ZETIA) 10 MG tablet Take 10 mg by mouth daily.    [provider]  ?lisinopril (PRINIVIL,ZESTRIL) 40 MG tablet Take 40 mg by mouth daily after breakfast. 02/08/19   [provider]  ?MAGNESIUM MALATE PO Take 1,300 mg by mouth.    [provider]  ?Melatonin 10 MG TABS as needed.    [provider]  ?Meth-Hyo-M Bl-Na Phos-Ph Sal (URO-MP) 118 MG CAPS Take 1 capsule by mouth as needed. ?Patient not taking: Reported on 12/12/2021    [provider]  ?Multiple Vitamins-Minerals (PRESERVISION AREDS PO) Take 2 tablets by mouth daily.    [provider]  ?omega-3 fish oil (MAXEPA) 1000 MG CAPS capsule Take 1 capsule by mouth daily.    [provider]  ?OVER THE COUNTER MEDICATION 1,000 mg daily. Beet root  ?Patient not taking:  Reported on 12/12/2021    [provider]  ?[place] Take by mouth as needed (pain). CURCUMIN    [provider]  ?OVER THE COUNTER MEDICATION Take 1 tablet by mouth daily. Medication Name: Theotis Burrow    [provider]  ?rosuvastatin (CRESTOR) 20 MG tablet Take 20 mg by mouth every other day. ?Patient not taking: Reported on 12/12/2021    [provider]  ?vitamin B-12 (CYANOCOBALAMIN) 1000 MCG tablet Take 1,000 mcg by mouth at bedtime.    [provider]  ?zinc gluconate 50 MG tablet Take 50 mg by mouth at bedtime. ?Patient not taking: Reported on 12/12/2021    [provider]  ?   ? ?Allergies    ?Penicillins and Statins   ? ?Review of Systems   ?Review of Systems  ?Gastrointestinal:  Positive for abdominal pain and nausea.  ?All other systems reviewed and are negative. ? ?Physical Exam ?Updated Vital Signs ?BP 118/83 (BP Location: Left Arm)   Pulse 93   Temp 98 ?F (36.7 ?C) (Oral)   Resp 16   SpO2 94%  ?Physical Exam ?Vitals and nursing note reviewed.  ?HENT:  ?   Head: Normocephalic.  ?   Mouth/Throat:  ?   Mouth: Mucous membranes are moist.  ?Eyes:  ?   Extraocular Movements: Extraocular movements  intact.  ?   Pupils: Pupils are equal, round, and reactive to light.  ?Cardiovascular:  ?   Rate and Rhythm: Normal rate and regular rhythm.  ?Pulmonary:  ?   Effort: Pulmonary effort is normal.  ?   Breath sounds: Normal breath sounds.  ?Abdominal:  ?   General: Abdomen is flat.  ?   Comments: Mild RLQ tenderness, no rebound   ?Skin: ?   General: Skin is warm.  ?   Capillary Refill: Capillary refill takes less than 2 seconds.  ?Neurological:  ?   General: No focal deficit present.  ?   Mental Status: He is alert and oriented to person, place, and time.  ?Psychiatric:     ?   Mood and Affect: Mood normal.     ?   Behavior: Behavior normal.  ? ? ?ED Results / Procedures / Treatments   ?Labs ?(all labs ordered are listed, but only abnormal  results are displayed) ?Labs Reviewed  ?COMPREHENSIVE METABOLIC PANEL - Abnormal; Notable for the following components:  ?    Result Value  ? Sodium 132 (*)   ? Potassium 3.4 (*)   ? Glucose, Bld 150 (*)   ? Calcium 8.6 (*)   ? Total Bilirubin 1.4 (*)   ? All other components within normal limits  ?CBC - Abnormal; Notable for the following components:  ? RBC 3.91 (*)   ? HCT 38.2 (*)   ? All other components within normal limits  ?URINALYSIS, ROUTINE W REFLEX MICROSCOPIC - Abnormal; Notable for the following components:  ? Ketones, ur 5 (*)   ? All other components within normal limits  ?LIPASE, BLOOD  ? ? ?EKG ?None ? ?Radiology ?No results found. ? ?Procedures ?Procedures  ? ? ?Medications Ordered in ED ?Medications  ?sodium chloride 0.9 % bolus 1,000 mL (has no administration in time range)  ?morphine (PF) 4 MG/ML injection 4 mg (has no administration in time range)  ?ondansetron (ZOFRAN) injection 4 mg (has no administration in time range)  ? ? ?ED Course/ Medical Decision Making/ A&P ?  ?                        ?Medical Decision Making ?Brandon Brown is a 86 y.o. male here with RLQ pain.  Patient has right lower quadrant pain and nausea for the last several days.  Patient states that he tripped and may have strained his abdomen.  Consider musculoskeletal pain versus appendicitis versus gastroenteritis.  Plan to get CBC and CMP and UA and CT abdomen pelvis.  We will give IV fluids and nausea medicine and pain medicine and reassess  ?  ?7:17 PM ?Labs unremarkable.  CT showed inflammation around the cecum.  The appendix is not definitely visualized.  I asked the patient if he had appendectomy and he states that he does not think so.  He did have inguinal hernia repair previously.  Patient has no obvious scar suggesting appendectomy.  I discussed case with Dr. Georgette Dover from surgery.  He states that since patient is on Eliquis and he will need a Eliquis washout anyway.  He recommend antibiotics and hold Eliquis for  several days.  He can return to the ER if he has worsening abdominal pain and will likely need repeat CT if he does return.  ? ?Problems Addressed: ?Cecal diverticulitis: acute illness or injury ? ?Amount and/or Complexity of Data Reviewed ?Labs: ordered. Decision-making details documented in ED Course. ?Radiology: ordered  and independent interpretation performed. Decision-making details documented in ED Course. ? ?Risk ?Prescription drug management. ? ? ?Final Clinical Impression(s) / ED Diagnoses ?Final diagnoses:  ?None  ? ? ?Rx / DC Orders ?ED Discharge Orders   ? ? None  ? ?  ? ? ?  ?Drenda Freeze, MD ?04/12/22 1922 ? ?

## 2022-04-12 NOTE — ED Triage Notes (Signed)
Pt reports abd pain since Saturday. Pt reports eating lots of salty and fried foods he usually doesn't eat on Saturday. Pt also reports tripping and feeling a strain in his pelvis. LBM was also Saturday. Was seen at PCP yesterday   ?

## 2022-04-12 NOTE — Discharge Instructions (Addendum)
You likely have cecal diverticulitis. ? ?Take Omnicef and Flagyl as prescribed. ? ?You need to hold Eliquis for the next 5 days in case you need surgery. ? ?You can take Tylenol for pain ? ?Return to ER if you have worsening abdominal pain, vomiting, fever.  ? ? ?

## 2022-04-13 ENCOUNTER — Telehealth (HOSPITAL_COMMUNITY): Payer: Self-pay | Admitting: Emergency Medicine

## 2022-04-13 MED ORDER — CIPROFLOXACIN HCL 500 MG PO TABS: 500.0000 mg | ORAL_TABLET | Freq: Two times a day (BID) | ORAL | 0 refills | Status: DC

## 2022-04-24 DIAGNOSIS — G629 Polyneuropathy, unspecified: Secondary | ICD-10-CM | POA: Diagnosis not present

## 2022-04-24 DIAGNOSIS — K529 Noninfective gastroenteritis and colitis, unspecified: Secondary | ICD-10-CM | POA: Diagnosis not present

## 2022-04-24 DIAGNOSIS — K5790 Diverticulosis of intestine, part unspecified, without perforation or abscess without bleeding: Secondary | ICD-10-CM | POA: Diagnosis not present

## 2022-04-26 NOTE — Progress Notes (Signed)
Carelink Summary Report / Loop Recorder 

## 2022-05-08 ENCOUNTER — Other Ambulatory Visit: Payer: Self-pay | Admitting: Internal Medicine

## 2022-05-08 DIAGNOSIS — I4891 Unspecified atrial fibrillation: Secondary | ICD-10-CM

## 2022-05-08 DIAGNOSIS — I639 Cerebral infarction, unspecified: Secondary | ICD-10-CM

## 2022-05-08 NOTE — Telephone Encounter (Signed)
Prescription refill request for Eliquis received. ?Indication: Afib  ?Last office visit: 12/12/21 Lovena Le) ?Scr: 0.75 (04/12/22) ?Age: 86 ?Weight: 79.5kg ? ?Appropriate dose and refill sent to requested pharmacy  ?

## 2022-05-12 LAB — CUP PACEART REMOTE DEVICE CHECK
Date Time Interrogation Session: 20230517232056
Implantable Pulse Generator Implant Date: 20200323

## 2022-05-15 ENCOUNTER — Ambulatory Visit (INDEPENDENT_AMBULATORY_CARE_PROVIDER_SITE_OTHER): Payer: Medicare PPO

## 2022-05-15 DIAGNOSIS — I639 Cerebral infarction, unspecified: Secondary | ICD-10-CM

## 2022-05-31 DIAGNOSIS — I739 Peripheral vascular disease, unspecified: Secondary | ICD-10-CM | POA: Diagnosis not present

## 2022-05-31 DIAGNOSIS — R7303 Prediabetes: Secondary | ICD-10-CM | POA: Diagnosis not present

## 2022-05-31 DIAGNOSIS — I10 Essential (primary) hypertension: Secondary | ICD-10-CM | POA: Diagnosis not present

## 2022-05-31 DIAGNOSIS — Z8673 Personal history of transient ischemic attack (TIA), and cerebral infarction without residual deficits: Secondary | ICD-10-CM | POA: Diagnosis not present

## 2022-05-31 DIAGNOSIS — I700 Atherosclerosis of aorta: Secondary | ICD-10-CM | POA: Diagnosis not present

## 2022-05-31 DIAGNOSIS — E7800 Pure hypercholesterolemia, unspecified: Secondary | ICD-10-CM | POA: Diagnosis not present

## 2022-05-31 DIAGNOSIS — G629 Polyneuropathy, unspecified: Secondary | ICD-10-CM | POA: Diagnosis not present

## 2022-05-31 DIAGNOSIS — I4891 Unspecified atrial fibrillation: Secondary | ICD-10-CM | POA: Diagnosis not present

## 2022-06-01 NOTE — Progress Notes (Signed)
Carelink Summary Report / Loop Recorder 

## 2022-06-12 ENCOUNTER — Other Ambulatory Visit (HOSPITAL_COMMUNITY): Payer: Self-pay | Admitting: Cardiology

## 2022-06-13 ENCOUNTER — Other Ambulatory Visit (HOSPITAL_COMMUNITY): Payer: Self-pay | Admitting: Physician Assistant

## 2022-06-13 DIAGNOSIS — I739 Peripheral vascular disease, unspecified: Secondary | ICD-10-CM

## 2022-06-16 ENCOUNTER — Ambulatory Visit (HOSPITAL_COMMUNITY)
Admission: RE | Admit: 2022-06-16 | Discharge: 2022-06-16 | Disposition: A | Discharge status: Home / Self Care | Payer: Medicare PPO | Source: Ambulatory Visit | Attending: Internal Medicine | Admitting: Internal Medicine

## 2022-06-16 DIAGNOSIS — I739 Peripheral vascular disease, unspecified: Secondary | ICD-10-CM

## 2022-06-19 ENCOUNTER — Ambulatory Visit (INDEPENDENT_AMBULATORY_CARE_PROVIDER_SITE_OTHER): Payer: Medicare PPO

## 2022-06-19 DIAGNOSIS — I639 Cerebral infarction, unspecified: Secondary | ICD-10-CM | POA: Diagnosis not present

## 2022-06-20 LAB — CUP PACEART REMOTE DEVICE CHECK
Date Time Interrogation Session: 20230619232450
Implantable Pulse Generator Implant Date: 20200323

## 2022-07-04 ENCOUNTER — Encounter: Payer: Medicare PPO | Admitting: Vascular Surgery

## 2022-07-11 ENCOUNTER — Ambulatory Visit: Payer: Medicare PPO | Admitting: Vascular Surgery

## 2022-07-11 ENCOUNTER — Encounter: Payer: Self-pay | Admitting: Vascular Surgery

## 2022-07-11 DIAGNOSIS — I739 Peripheral vascular disease, unspecified: Secondary | ICD-10-CM | POA: Diagnosis not present

## 2022-07-11 MED ORDER — CILOSTAZOL 100 MG PO TABS: 100.0000 mg | ORAL_TABLET | Freq: Two times a day (BID) | ORAL | 11 refills | Status: DC

## 2022-07-11 NOTE — Progress Notes (Signed)
Patient name: Brandon Brown MRN: [id number] DOB: November 11, 1936 Sex: male  REASON FOR CONSULT: Evaluation PAD  HPI: Brandon Brown is a 86 y.o. male, with history of hypertension, hyperlipidemia, prediabetes, atrial fibrillation on Eliquis presents for evaluation of bilateral lower extremity calf claudication.  Patient's had symptoms for years according to his wife.  He gets cramping in both calves particular when walking on the treadmill or going for a long walk around [place].  He has no rest pain at night or open wounds to suggest critical limb ischemia.  He was able to walk about 10 minutes on the treadmill and that he walks through the symptoms. ABIs on 06/16/2022 were 0.65 on the right monophasic and 0.64 on the left monophasic.  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   Arthritis    per notes from [facility] at [facility]   Dysphagia due to old stroke    per notes from [facility] at [place]   Hx of prostatitis    Hyperlipidemia    Hypertension    Peripheral neuropathy    per notes from [facility]   Prediabetes    per notes from [facility])    Skin cancer    per notes from [facility] at [facility]    Past Surgical History:  Procedure Laterality Date   bone spur left shoulder     EXCISION GANGLION CYST RIGHT ANKLE  11/15/2015   Dr Creig Hines; per notes from [place] at [place] Left    LEFT LONG FINGER (per notes from [facility])   [place] Bilateral    "left and right lower abdominal hernia"   HYDROCELE EXCISION Left    INCISION / DRAINAGE HAND / FINGER     KNEE ARTHROSCOPY     LOOP RECORDER  INSERTION N/A 03/17/2019   Procedure: LOOP RECORDER INSERTION;  Surgeon: Evans Lance, MD;  Location: [place] [facility];  Service: Cardiovascular;  Laterality: N/A;   PROSTATE BIOPSY     ROTATOR CUFF REPAIR Left    per notes from [facility] at [place]     tunica vaginalis excision of hydrocele right      Family History  Problem Relation Age of Onset   Heart disease Mother    Non-Hodgkin's lymphoma Mother    Hypertension Father        per notes from [facility] at [facility]   Arthritis Father        per notes from [facility] at [place] failure Father    Diabetes Brother        per notes from [facility] at [place] attack Other        paternal grandparent (per notes from [facility] [place])    SOCIAL HISTORY: Social History   Socioeconomic History   Marital status: Married    Spouse name: Not on file   Number of children: Not on file   Years of education: Not  on file   Highest education level: Not on file  Occupational History   Not on file  Tobacco Use   Smoking status: Never   Smokeless tobacco: Former    Types: Chew    Quit date: 73  Substance and Sexual Activity   Alcohol use: Yes    Comment: 1 beer every once in awhile    Drug use: No   Sexual activity: Never  Other Topics Concern   Not on file  Social History Narrative   Lives at home   Caffeine: never   Social Determinants of Radio broadcast assistant Strain: Not on file  Food Insecurity: Not on file  Transportation Needs: Not on file  Physical Activity: Not on file  Stress: Not on file  Social Connections: Not on file  Intimate Partner Violence: Not on file    Allergies  Allergen Reactions   Penicillins Other (See Comments)    Unknown Did it involve swelling of the face/tongue/throat, SOB, or  low BP? Unknown Did it involve sudden or severe rash/hives, skin peeling, or any reaction on the inside of your mouth or nose? Unknown Did you need to seek medical attention at a hospital or doctor's office? Unknown When did it last happen? Unknown  If all above answers are "NO", may proceed with cephalosporin use.    Statins Other (See Comments)    Joints, muscles ache    Current Outpatient Medications  Medication Sig Dispense Refill   amLODipine (NORVASC) 5 MG tablet Take 5 mg by mouth daily.     ascorbic acid (VITAMIN C) 1000 MG tablet daily.     Coenzyme Q10 10 MG capsule daily.     ELIQUIS 5 MG TABS tablet TAKE 1 TABLET BY MOUTH TWICE A DAY 60 tablet 5   ezetimibe (ZETIA) 10 MG tablet Take 10 mg by mouth daily.     lisinopril (PRINIVIL,ZESTRIL) 40 MG tablet Take 40 mg by mouth daily after breakfast.     MAGNESIUM MALATE PO Take 1,300 mg by mouth.     Melatonin 10 MG TABS as needed.     Meth-Hyo-M Bl-Na Phos-Ph Sal (URO-MP) 118 MG CAPS Take 1 capsule by mouth as needed.     Multiple Vitamins-Minerals (PRESERVISION AREDS PO) Take 2 tablets by mouth daily.     omega-3 fish oil (MAXEPA) 1000 MG CAPS capsule Take 1 capsule by mouth daily.     OVER THE COUNTER MEDICATION 1,000 mg daily. Beet root     OVER THE COUNTER MEDICATION Take by mouth as needed (pain). CURCUMIN     OVER THE COUNTER MEDICATION Take 1 tablet by mouth daily. Medication Name: Theotis Burrow     rosuvastatin (CRESTOR) 20 MG tablet Take 20 mg by mouth every other day.     vitamin B-12 (CYANOCOBALAMIN) 1000 MCG tablet Take 1,000 mcg by mouth at bedtime.     zinc gluconate 50 MG tablet Take 50 mg by mouth at bedtime.     cefdinir (OMNICEF) 300 MG capsule Take 1 capsule (300 mg total) by mouth 2 (two) times daily. (Patient not taking: Reported on 07/11/2022) 14 capsule 0   ciprofloxacin (CIPRO) 500 MG tablet Take 1 tablet (500 mg total) by mouth every 12 (twelve) hours. (Patient not taking: Reported on 07/11/2022) 20 tablet 0    metroNIDAZOLE (FLAGYL) 500 MG tablet Take 1 tablet (500 mg total) by mouth 2 (two) times daily. One po bid x 7 days (Patient not taking: Reported on 07/11/2022) 14 tablet 0  No current facility-administered medications for this visit.    REVIEW OF SYSTEMS:  '[X]'$  denotes positive finding, '[ ]'$  denotes negative finding Cardiac  Comments:  Chest pain or chest pressure:    Shortness of breath upon exertion:    Short of breath when lying flat:    Irregular heart rhythm:        Vascular    Pain in calf, thigh, or hip brought on by ambulation: x   Pain in feet at night that wakes you up from your sleep:     Blood clot in your veins:    Leg swelling:         Pulmonary    Oxygen at home:    Productive cough:     Wheezing:         Neurologic    Sudden weakness in arms or legs:     Sudden numbness in arms or legs:     Sudden onset of difficulty speaking or slurred speech:    Temporary loss of vision in one eye:     Problems with dizziness:         Gastrointestinal    Blood in stool:     Vomited blood:         Genitourinary    Burning when urinating:     Blood in urine:        Psychiatric    Major depression:         Hematologic    Bleeding problems:    Problems with blood clotting too easily:        Skin    Rashes or ulcers:        Constitutional    Fever or chills:      PHYSICAL EXAM: Vitals:   07/11/22 1109  BP: (!) 143/78  Pulse: 88  Resp: 18  Temp: (!) 97.2 F (36.2 C)  TempSrc: Temporal  SpO2: 96%  Weight: 173 lb (78.5 kg)  Height: '5\' 11"'$  (1.803 m)    GENERAL: The patient is a well-nourished male, in no acute distress. The vital signs are documented above. CARDIAC: There is a regular rate and rhythm.  VASCULAR:  Palpable femoral pulses bilaterally No palpable popliteal pulses No palpable pedal pulses No lower extremity tissue loss PULMONARY: No respiratory distress. ABDOMEN: Soft and non-tender. MUSCULOSKELETAL: There are no major deformities or  cyanosis. NEUROLOGIC: No focal weakness or paresthesias are detected. SKIN: There are no ulcers or rashes noted. PSYCHIATRIC: The patient has a normal affect.  DATA:   ABIs on 06/16/2022 were 0.65 on the right monophasic and 0.64 on the left monophasic.  Assessment/Plan:  86 year old male presents for evaluation of bilateral lower extremity calf claudication.  Discussed his ABIs indicate he does have evidence of moderate arterial disease.  Discussed we typically try and manage claudication conservatively and not with surgical intervention until it becomes lifestyle limiting.  His symptoms do not appear lifestyle limiting at this time as he is able to walk on the treadmill for about 10 minutes and then walks through his symptoms.  No signs of rest pain or wounds to suggest critical limb ischemia.  I have recommended medical management including walking therapies and Pletal.  He is on Crestor for risk reduction as well.  I will see him in 3 months with ABIs   Marty Heck, MD [facility]: [phone]

## 2022-07-13 NOTE — Progress Notes (Signed)
Carelink Summary Report / Loop Recorder 

## 2022-07-19 ENCOUNTER — Other Ambulatory Visit: Payer: Self-pay

## 2022-07-19 DIAGNOSIS — I739 Peripheral vascular disease, unspecified: Secondary | ICD-10-CM

## 2022-07-20 LAB — CUP PACEART REMOTE DEVICE CHECK
Date Time Interrogation Session: 20230722233546
Implantable Pulse Generator Implant Date: 20200323

## 2022-07-24 ENCOUNTER — Ambulatory Visit (INDEPENDENT_AMBULATORY_CARE_PROVIDER_SITE_OTHER): Payer: Medicare PPO

## 2022-07-24 DIAGNOSIS — I639 Cerebral infarction, unspecified: Secondary | ICD-10-CM | POA: Diagnosis not present

## 2022-08-25 ENCOUNTER — Ambulatory Visit (INDEPENDENT_AMBULATORY_CARE_PROVIDER_SITE_OTHER): Payer: Medicare PPO

## 2022-08-25 DIAGNOSIS — I639 Cerebral infarction, unspecified: Secondary | ICD-10-CM | POA: Diagnosis not present

## 2022-08-25 NOTE — Progress Notes (Signed)
Carelink Summary Report / Loop Recorder 

## 2022-08-28 LAB — CUP PACEART REMOTE DEVICE CHECK
Date Time Interrogation Session: 20230831212348
Implantable Pulse Generator Implant Date: 20200323

## 2022-08-29 DIAGNOSIS — M25532 Pain in left wrist: Secondary | ICD-10-CM | POA: Diagnosis not present

## 2022-08-29 DIAGNOSIS — M25432 Effusion, left wrist: Secondary | ICD-10-CM | POA: Diagnosis not present

## 2022-08-30 ENCOUNTER — Telehealth: Payer: Self-pay

## 2022-08-30 DIAGNOSIS — M25432 Effusion, left wrist: Secondary | ICD-10-CM | POA: Diagnosis not present

## 2022-08-30 DIAGNOSIS — M25532 Pain in left wrist: Secondary | ICD-10-CM | POA: Diagnosis not present
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
## 2022-09-14 NOTE — Progress Notes (Signed)
Carelink Summary Report / Loop Recorder 

## 2022-09-26 LAB — CUP PACEART REMOTE DEVICE CHECK
Date Time Interrogation Session: 20231002232509
Implantable Pulse Generator Implant Date: 20200323

## 2022-09-27 ENCOUNTER — Ambulatory Visit (INDEPENDENT_AMBULATORY_CARE_PROVIDER_SITE_OTHER): Payer: Medicare PPO

## 2022-09-27 DIAGNOSIS — I639 Cerebral infarction, unspecified: Secondary | ICD-10-CM

## 2022-10-03 ENCOUNTER — Encounter: Payer: Self-pay | Admitting: Vascular Surgery

## 2022-10-03 ENCOUNTER — Ambulatory Visit: Payer: Medicare PPO | Admitting: Vascular Surgery

## 2022-10-03 ENCOUNTER — Ambulatory Visit (HOSPITAL_COMMUNITY)
Admission: RE | Admit: 2022-10-03 | Discharge: 2022-10-03 | Disposition: A | Discharge status: Home / Self Care | Payer: Medicare PPO | Source: Ambulatory Visit | Attending: Vascular Surgery | Admitting: Vascular Surgery

## 2022-10-03 VITALS — BP 122/75 | HR 88 | Temp 97.3°F | Resp 16 | Ht 72.0 in | Wt 171.0 lb

## 2022-10-03 DIAGNOSIS — I739 Peripheral vascular disease, unspecified: Secondary | ICD-10-CM | POA: Diagnosis not present

## 2022-10-03 MED ORDER — CILOSTAZOL 100 MG PO TABS: 100.0000 mg | ORAL_TABLET | Freq: Two times a day (BID) | ORAL | 11 refills | Status: DC

## 2022-10-03 NOTE — Progress Notes (Signed)
Patient name: Brandon Brown MRN: [id number] DOB: 1935/12/27 Sex: male  REASON FOR CONSULT: 3 month follow-up lower extremity claudication  HPI: Brandon Brown is a 86 y.o. male, with history of hypertension, hyperlipidemia, prediabetes, atrial fibrillation on Eliquis presents for 3 month follow-up of bilateral lower extremity calf claudication.  He previously described getting cramping in both calves particular when walking on the treadmill or going for a long walk around [place].  He has no rest pain at night or open wounds to suggest critical limb ischemia.  He was able to walk about 10 minutes on the treadmill and that he walks through the symptoms. ABIs on 06/16/2022 were 0.65 on the right monophasic and 0.64 on the left monophasic.  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  We previously placed him on Pletal.  States he is seeing improvement on the Pletal.  Still gets some mild cramping in his calves but still able to walk 10 minutes on the treadmill and then stops.  ABIs today are 0.56 on the right and 0.76 on the left.  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   Dysphagia due to old stroke    per notes from [facility] at [place]   Hx of prostatitis    Hyperlipidemia    Hypertension    Peripheral neuropathy    [facility]   Prediabetes    per notes from [facility] [place] [facility])    Skin cancer    per notes from [place] at [facility]    Past Surgical History:  Procedure Laterality Date   bone spur left shoulder     EXCISION GANGLION CYST RIGHT ANKLE  11/15/2015   Dr Creig Hines; per notes from [place] [facility] [place] Left     LEFT LONG FINGER (per notes from [facility])   [place] Bilateral    "left and right lower abdominal hernia"   HYDROCELE EXCISION Left    INCISION / DRAINAGE HAND / FINGER     KNEE ARTHROSCOPY     LOOP RECORDER INSERTION N/A 03/17/2019   Procedure: LOOP RECORDER INSERTION;  Surgeon: Evans Lance, MD;  Location: [place] [facility];  Service: Cardiovascular;  Laterality: N/A;   PROSTATE BIOPSY     ROTATOR CUFF REPAIR Left    per notes from [facility] [place]     tunica vaginalis excision of hydrocele right      Family History  Problem Relation Age of Onset   Heart disease Mother    Non-Hodgkin's lymphoma Mother    Hypertension Father        per notes from [facility]   Arthritis Father        per notes from [facility] [place] failure Father    Diabetes Brother        per notes from [facility] [place] attack Other  paternal grandparent (per notes from [facility] [place])    SOCIAL HISTORY: Social History   Socioeconomic History   Marital status: Married    Spouse name: Not on file   Number of children: Not on file   Years of education: Not on file   Highest education level: Not on file  Occupational History   Not on file  Tobacco Use   Smoking status: Never   Smokeless tobacco: Former    Types: Chew    Quit date: 72  Substance and Sexual Activity   Alcohol use: Yes    Comment: 1 beer every once in awhile    Drug use: No   Sexual activity: Never  Other Topics Concern   Not on file  Social History Narrative   Lives at home   Caffeine: never   Social Determinants of Radio broadcast assistant Strain: Not on file  Food Insecurity: Not on file  Transportation Needs: Not on file  Physical  Activity: Not on file  Stress: Not on file  Social Connections: Not on file  Intimate Partner Violence: Not on file    Allergies  Allergen Reactions   Penicillins Other (See Comments)    Unknown Did it involve swelling of the face/tongue/throat, SOB, or low BP? Unknown Did it involve sudden or severe rash/hives, skin peeling, or any reaction on the inside of your mouth or nose? Unknown Did you need to seek medical attention at a hospital or doctor's office? Unknown When did it last happen? Unknown  If all above answers are "NO", may proceed with cephalosporin use.    Statins Other (See Comments)    Joints, muscles ache    Current Outpatient Medications  Medication Sig Dispense Refill   amLODipine (NORVASC) 5 MG tablet Take 5 mg by mouth daily.     ascorbic acid (VITAMIN C) 1000 MG tablet daily.     cilostazol (PLETAL) 100 MG tablet Take 1 tablet (100 mg total) by mouth 2 (two) times daily before a meal. 60 tablet 11   Coenzyme Q10 10 MG capsule daily.     ELIQUIS 5 MG TABS tablet TAKE 1 TABLET BY MOUTH TWICE A DAY 60 tablet 5   ezetimibe (ZETIA) 10 MG tablet Take 10 mg by mouth daily.     lisinopril (PRINIVIL,ZESTRIL) 40 MG tablet Take 40 mg by mouth daily after breakfast.     MAGNESIUM MALATE PO Take 1,300 mg by mouth.     Melatonin 10 MG TABS as needed.     Meth-Hyo-M Bl-Na Phos-Ph Sal (URO-MP) 118 MG CAPS Take 1 capsule by mouth as needed.     Multiple Vitamins-Minerals (PRESERVISION AREDS PO) Take 2 tablets by mouth daily.     omega-3 fish oil (MAXEPA) 1000 MG CAPS capsule Take 1 capsule by mouth daily.     OVER THE COUNTER MEDICATION 1,000 mg daily. Beet root     OVER THE COUNTER MEDICATION Take by mouth as needed (pain). CURCUMIN     OVER THE COUNTER MEDICATION Take 1 tablet by mouth daily. Medication Name: Theotis Burrow     rosuvastatin (CRESTOR) 20 MG tablet Take 20 mg by mouth every other day.     vitamin B-12 (CYANOCOBALAMIN) 1000 MCG tablet Take 1,000 mcg by mouth at  bedtime.     zinc gluconate 50 MG tablet Take 50 mg by mouth at bedtime.     cefdinir (OMNICEF) 300 MG capsule Take 1 capsule (300 mg total) by mouth 2 (two)  times daily. (Patient not taking: Reported on 07/11/2022) 14 capsule 0   ciprofloxacin (CIPRO) 500 MG tablet Take 1 tablet (500 mg total) by mouth every 12 (twelve) hours. (Patient not taking: Reported on 07/11/2022) 20 tablet 0   metroNIDAZOLE (FLAGYL) 500 MG tablet Take 1 tablet (500 mg total) by mouth 2 (two) times daily. One po bid x 7 days (Patient not taking: Reported on 07/11/2022) 14 tablet 0   No current facility-administered medications for this visit.    REVIEW OF SYSTEMS:  '[X]'$  denotes positive finding, '[ ]'$  denotes negative finding Cardiac  Comments:  Chest pain or chest pressure:    Shortness of breath upon exertion:    Short of breath when lying flat:    Irregular heart rhythm:        Vascular    Pain in calf, thigh, or hip brought on by ambulation: x   Pain in feet at night that wakes you up from your sleep:     Blood clot in your veins:    Leg swelling:         Pulmonary    Oxygen at home:    Productive cough:     Wheezing:         Neurologic    Sudden weakness in arms or legs:     Sudden numbness in arms or legs:     Sudden onset of difficulty speaking or slurred speech:    Temporary loss of vision in one eye:     Problems with dizziness:         Gastrointestinal    Blood in stool:     Vomited blood:         Genitourinary    Burning when urinating:     Blood in urine:        Psychiatric    Major depression:         Hematologic    Bleeding problems:    Problems with blood clotting too easily:        Skin    Rashes or ulcers:        Constitutional    Fever or chills:      PHYSICAL EXAM: Vitals:   10/03/22 1453  BP: 122/75  Pulse: 88  Resp: 16  Temp: (!) 97.3 F (36.3 C)  TempSrc: Temporal  SpO2: 94%  Weight: 171 lb (77.6 kg)  Height: 6' (1.829 m)    GENERAL: The patient is a  well-nourished male, in no acute distress. The vital signs are documented above. CARDIAC: There is a regular rate and rhythm.  VASCULAR:  Palpable femoral pulses bilaterally No palpable pedal pulses No lower extremity tissue loss PULMONARY: No respiratory distress. ABDOMEN: Soft and non-tender. MUSCULOSKELETAL: There are no major deformities or cyanosis. NEUROLOGIC: No focal weakness or paresthesias are detected. SKIN: There are no ulcers or rashes noted. PSYCHIATRIC: The patient has a normal affect.  DATA:   ABIs today are 0.56 right monophasic and 0.76 left monophasic   Assessment/Plan:  86 year old male presents for 3 month follow-up of his bilateral lower extremity calf claudication.  We previously placed him on Pletal and discussed lifestyle modifications with walking therapies.  He has seen significant improvement on the Pletal and overall looks good today.  He is walking on a treadmill for 10 minutes.  I do not see any indication for lower extremity intervention.  Again no signs of critical limb ischemia like rest pain or tissue loss.  I will see him in 1  year with ABIs.  I did refill his Pletal today.  Marty Heck, MD [facility] [place] Office: [phone]

## 2022-10-05 NOTE — Progress Notes (Signed)
Carelink Summary Report / Loop Recorder 

## 2022-10-23 ENCOUNTER — Ambulatory Visit: Payer: Medicare PPO | Attending: Internal Medicine | Admitting: Internal Medicine

## 2022-10-23 ENCOUNTER — Encounter: Payer: Self-pay | Admitting: Internal Medicine

## 2022-10-23 VITALS — BP 124/78 | HR 80 | Ht 71.0 in | Wt 174.0 lb

## 2022-10-23 DIAGNOSIS — I480 Paroxysmal atrial fibrillation: Secondary | ICD-10-CM

## 2022-10-23 DIAGNOSIS — I639 Cerebral infarction, unspecified: Secondary | ICD-10-CM | POA: Diagnosis not present

## 2022-10-23 NOTE — Patient Instructions (Addendum)
Medication Instructions:  Your physician recommends that you continue on your current medications as directed. Please refer to the Current Medication list given to you today.  *If you need a refill on your cardiac medications before your next appointment, please call your pharmacy*  Lab Work: None ordered.  If you have labs (blood work) drawn today and your tests are completely normal, you will receive your results only by: [place] (if you have MyChart) OR A paper copy in the mail If you have any lab test that is abnormal or we need to change your treatment, we will call you to review the results.  Testing/Procedures: None ordered.  Follow-Up: At [facility], you and your health needs are our priority.  As part of our continuing mission to provide you with exceptional heart care, we have created designated Provider Care Teams.  These Care Teams include your primary Cardiologist (physician) and Advanced Practice Providers (APPs -  Physician Assistants and Nurse Practitioners) who all work together to provide you with the care you need, when you need it.  We recommend signing up for the patient portal called "MyChart".  Sign up information is provided on this After Visit Summary.  MyChart is used to connect with patients for Virtual Visits (Telemedicine).  Patients are able to view lab/test results, encounter notes, upcoming appointments, etc.  Non-urgent messages can be sent to your provider as well.   To learn more about what you can do with MyChart, go to [email or web address].    Your next appointment:   1 year(s) Follow up appointment to schedule.   The format for your next appointment:   In Person  Provider:   Cristopher Peru, MD{or one of the following Advanced Practice Providers on your designated Care Team:   Tommye Standard, [place] Legrand Como "Jonni Sanger" Chalmers Cater, [place]    Important Information About Sugar

## 2022-10-23 NOTE — Progress Notes (Signed)
HPI [name] returns today for ongoing evaluation and management.  He is a very pleasant 86 year old man who had a history of cryptogenic stroke and status post implantable loop recorder.  The patient was found to have atrial fibrillation and was started on Eliquis.  He returns today for additional evaluation.  He denies chest pain or shortness of breath.  No recurrent neurological events. He has some tingling in his hands. He does not feel his atrial fib. His ILR has reached end of service.  Allergies  Allergen Reactions   Penicillins Other (See Comments)    Unknown Did it involve swelling of the face/tongue/throat, SOB, or low BP? Unknown Did it involve sudden or severe rash/hives, skin peeling, or any reaction on the inside of your mouth or nose? Unknown Did you need to seek medical attention at a hospital or doctor's office? Unknown When did it last happen? Unknown  If all above answers are "NO", may proceed with cephalosporin use.    Statins Other (See Comments)    Joints, muscles ache     Current Outpatient Medications  Medication Sig Dispense Refill   amLODipine (NORVASC) 5 MG tablet Take 5 mg by mouth daily.     ascorbic acid (VITAMIN C) 1000 MG tablet daily.     cilostazol (PLETAL) 100 MG tablet Take 1 tablet (100 mg total) by mouth 2 (two) times daily before a meal. 60 tablet 11   Coenzyme Q10 10 MG capsule daily.     ELIQUIS 5 MG TABS tablet TAKE 1 TABLET BY MOUTH TWICE A DAY 60 tablet 5   ezetimibe (ZETIA) 10 MG tablet Take 10 mg by mouth daily.     lisinopril (PRINIVIL,ZESTRIL) 40 MG tablet Take 40 mg by mouth daily after breakfast.     MAGNESIUM MALATE PO Take 1,300 mg by mouth.     Melatonin 10 MG TABS as needed.     Multiple Vitamins-Minerals (PRESERVISION AREDS PO) Take 2 tablets by mouth daily.     omega-3 fish oil (MAXEPA) 1000 MG CAPS capsule Take 1 capsule by mouth daily.     OVER THE COUNTER MEDICATION Take by mouth as needed (pain). CURCUMIN     OVER  THE COUNTER MEDICATION Take 1 tablet by mouth daily. Medication Name: Theotis Burrow     vitamin B-12 (CYANOCOBALAMIN) 1000 MCG tablet Take 1,000 mcg by mouth at bedtime.     No current facility-administered medications for this visit.     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   Past Surgical History:  Procedure Laterality Date   bone spur left shoulder     EXCISION GANGLION CYST RIGHT ANKLE  11/15/2015   Dr Creig Hines; per notes from [facility] [place] Left    LEFT LONG FINGER (per notes from [facility])   HERNIA REPAIR Bilateral    "left and right lower abdominal hernia"   HYDROCELE EXCISION Left    INCISION / DRAINAGE HAND / FINGER     KNEE ARTHROSCOPY     LOOP RECORDER INSERTION N/A 03/17/2019   Procedure: LOOP RECORDER INSERTION;  Surgeon: Evans Lance, MD;  Location: [place] [facility];  Service: Cardiovascular;  Laterality: N/A;   PROSTATE BIOPSY     ROTATOR CUFF REPAIR Left    per notes from [facility] [place]     tunica vaginalis excision of hydrocele right       Family History  Problem Relation Age of Onset   Heart disease Mother     Non-Hodgkin's lymphoma Mother    Hypertension Father        per notes from [facility] [place] Father        per notes from [facility] [place] failure Father    Diabetes Brother        per notes from [facility] [place] attack Other        paternal grandparent (per notes from [facility] [place])     Social History   Socioeconomic History   Marital status: Married    Spouse name: Not on file   Number of children: Not on file   Years of education: Not on file   Highest education level: Not on file  Occupational History   Not on file  Tobacco Use   Smoking status: Never   Smokeless tobacco: Former    Types: Loss adjuster, chartered    Quit date: 1992  Substance and Sexual Activity   Alcohol use: Yes    Comment: 1 beer every once in awhile    Drug use: No   Sexual activity: Never  Other Topics Concern   Not on file  Social History Narrative   Lives at home   Caffeine: never   Social Determinants of Radio broadcast assistant Strain: Not on file  Food Insecurity: Not on file  Transportation Needs: Not on file  Physical Activity: Not on file  Stress: Not on file  Social Connections: Not on file  Intimate Partner Violence: Not on file     BP 124/78   Pulse 80   Ht '5\' 11"'$  (1.803 m)   Wt 174 lb (78.9 kg)   BMI 24.27 kg/m   Physical Exam:  Well appearing NAD HEENT: Unremarkable Neck:  No JVD, no thyromegally Lymphatics:  No adenopathy Back:  No CVA tenderness Lungs:  Clear with no wheezes HEART:  Regular rate rhythm, 2/6 systolic murmurs, no rubs, no clicks Abd:  soft, positive bowel sounds, no organomegally, no rebound, no guarding Ext:  2 plus pulses, no edema, no cyanosis, no clubbing Skin:  No rashes no nodules Neuro:  CN II through XII intact, motor grossly intact  DEVICE  Normal device function.  See PaceArt for details. RRT  Assess/Plan:  1.   Cryptogenic stroke -he has subsequently been found to have atrial fibrillation and will continue systemic anticoagulation 2.  Paroxysmal atrial fibrillation -he is minimally if at  all symptomatic.  No change in his medical therapy for now.  If he were to develop persistent atrial fibrillation, then we would consider either antiarrhythmic or rate control. He has decided not to have his ILR removed. 3.  Coags -he has not had any bleeding on systemic anticoagulation. 4.  Dyslipidemia -he will continue statin therapy.   Cristopher Peru, MD

## 2022-10-26 ENCOUNTER — Telehealth: Payer: Self-pay

## 2022-10-26 NOTE — Telephone Encounter (Signed)
I ordered the patient a return kit. He should receive it in 7-10 business days.

## 2022-12-26 DIAGNOSIS — I739 Peripheral vascular disease, unspecified: Secondary | ICD-10-CM | POA: Diagnosis not present

## 2022-12-26 DIAGNOSIS — I700 Atherosclerosis of aorta: Secondary | ICD-10-CM | POA: Diagnosis not present

## 2022-12-26 DIAGNOSIS — I10 Essential (primary) hypertension: Secondary | ICD-10-CM | POA: Diagnosis not present

## 2022-12-26 DIAGNOSIS — I4891 Unspecified atrial fibrillation: Secondary | ICD-10-CM | POA: Diagnosis not present

## 2022-12-26 DIAGNOSIS — Z8673 Personal history of transient ischemic attack (TIA), and cerebral infarction without residual deficits: Secondary | ICD-10-CM | POA: Diagnosis not present

## 2022-12-26 DIAGNOSIS — E7800 Pure hypercholesterolemia, unspecified: Secondary | ICD-10-CM | POA: Diagnosis not present

## 2022-12-26 DIAGNOSIS — R7303 Prediabetes: Secondary | ICD-10-CM | POA: Diagnosis not present

## 2022-12-26 DIAGNOSIS — G629 Polyneuropathy, unspecified: Secondary | ICD-10-CM | POA: Diagnosis not present

## 2023-01-04 DIAGNOSIS — D485 Neoplasm of uncertain behavior of skin: Secondary | ICD-10-CM | POA: Diagnosis not present

## 2023-01-10 DIAGNOSIS — C44629 Squamous cell carcinoma of skin of left upper limb, including shoulder: Secondary | ICD-10-CM | POA: Diagnosis not present

## 2023-05-01 DIAGNOSIS — C44629 Squamous cell carcinoma of skin of left upper limb, including shoulder: Secondary | ICD-10-CM | POA: Diagnosis not present

## 2023-05-01 DIAGNOSIS — L820 Inflamed seborrheic keratosis: Secondary | ICD-10-CM | POA: Diagnosis not present

## 2023-05-04 DIAGNOSIS — R300 Dysuria: Secondary | ICD-10-CM | POA: Diagnosis not present

## 2023-05-04 DIAGNOSIS — C61 Malignant neoplasm of prostate: Secondary | ICD-10-CM | POA: Diagnosis not present

## 2023-05-04 DIAGNOSIS — Z8546 Personal history of malignant neoplasm of prostate: Secondary | ICD-10-CM | POA: Diagnosis not present

## 2023-07-04 ENCOUNTER — Telehealth: Payer: Self-pay

## 2023-07-04 NOTE — Telephone Encounter (Signed)
Pt called c/o R hand burning x 3 mos, getting progressively worse, worse at HS, and affects his sleep.  Reviewed pt's chart, returned call for clarification, no answer, vm not setup.  Pt called.  Returned pt's call, two identifiers used. Pt denies any wounds or swelling. His hand is sometimes a bit cool, but not significant. He has dropped a pen a couple of times. He notes a sore area near his R shoulder. Pt does weight training regularly. Discussed the likelihood that it was nerve pain. After several min of recalling past events, pt reports that he was using a weed eater and holding it away from his body and downward trying to cut grass on a hill. He stated that same area at his shoulder hurt significantly for a few days. Discussed that he may have aggravated that spot with weight training. Pt agreed and he is going to f/u with his PCP at his appt later in July. Instructed him to call back if his PCP feels that he needs to be seen here and will send referral. Confirmed understanding.

## 2023-07-16 DIAGNOSIS — I700 Atherosclerosis of aorta: Secondary | ICD-10-CM | POA: Diagnosis not present

## 2023-07-16 DIAGNOSIS — E7800 Pure hypercholesterolemia, unspecified: Secondary | ICD-10-CM | POA: Diagnosis not present

## 2023-07-16 DIAGNOSIS — I10 Essential (primary) hypertension: Secondary | ICD-10-CM | POA: Diagnosis not present

## 2023-07-16 DIAGNOSIS — Z1331 Encounter for screening for depression: Secondary | ICD-10-CM | POA: Diagnosis not present

## 2023-07-16 DIAGNOSIS — G629 Polyneuropathy, unspecified: Secondary | ICD-10-CM | POA: Diagnosis not present

## 2023-07-16 DIAGNOSIS — I739 Peripheral vascular disease, unspecified: Secondary | ICD-10-CM | POA: Diagnosis not present

## 2023-07-16 DIAGNOSIS — Z9181 History of falling: Secondary | ICD-10-CM | POA: Diagnosis not present

## 2023-07-16 DIAGNOSIS — I4891 Unspecified atrial fibrillation: Secondary | ICD-10-CM | POA: Diagnosis not present

## 2023-07-16 DIAGNOSIS — R7303 Prediabetes: Secondary | ICD-10-CM | POA: Diagnosis not present

## 2023-08-02 DIAGNOSIS — M65341 Trigger finger, right ring finger: Secondary | ICD-10-CM | POA: Diagnosis not present

## 2023-08-02 DIAGNOSIS — M1811 Unilateral primary osteoarthritis of first carpometacarpal joint, right hand: Secondary | ICD-10-CM | POA: Diagnosis not present

## 2023-08-02 DIAGNOSIS — M79641 Pain in right hand: Secondary | ICD-10-CM | POA: Diagnosis not present

## 2023-08-02 DIAGNOSIS — M65331 Trigger finger, right middle finger: Secondary | ICD-10-CM | POA: Diagnosis not present

## 2023-08-02 DIAGNOSIS — R200 Anesthesia of skin: Secondary | ICD-10-CM | POA: Diagnosis not present

## 2023-09-25 ENCOUNTER — Other Ambulatory Visit: Payer: Self-pay | Admitting: *Deleted

## 2023-09-25 DIAGNOSIS — I739 Peripheral vascular disease, unspecified: Secondary | ICD-10-CM

## 2023-10-09 ENCOUNTER — Ambulatory Visit: Payer: Medicare PPO | Admitting: Vascular Surgery

## 2023-10-09 ENCOUNTER — Encounter (HOSPITAL_COMMUNITY): Payer: Medicare PPO

## 2023-10-12 DIAGNOSIS — Z23 Encounter for immunization: Secondary | ICD-10-CM | POA: Diagnosis not present

## 2023-11-08 ENCOUNTER — Other Ambulatory Visit: Payer: Self-pay | Admitting: Vascular Surgery

## 2024-01-22 DIAGNOSIS — E7800 Pure hypercholesterolemia, unspecified: Secondary | ICD-10-CM | POA: Diagnosis not present

## 2024-01-22 DIAGNOSIS — Z139 Encounter for screening, unspecified: Secondary | ICD-10-CM | POA: Diagnosis not present

## 2024-01-22 DIAGNOSIS — R7303 Prediabetes: Secondary | ICD-10-CM | POA: Diagnosis not present

## 2024-01-22 DIAGNOSIS — I10 Essential (primary) hypertension: Secondary | ICD-10-CM | POA: Diagnosis not present

## 2024-04-28 DIAGNOSIS — H52223 Regular astigmatism, bilateral: Secondary | ICD-10-CM | POA: Diagnosis not present

## 2024-04-28 DIAGNOSIS — H5203 Hypermetropia, bilateral: Secondary | ICD-10-CM | POA: Diagnosis not present

## 2024-04-28 DIAGNOSIS — H524 Presbyopia: Secondary | ICD-10-CM | POA: Diagnosis not present

## 2024-05-23 DIAGNOSIS — R3989 Other symptoms and signs involving the genitourinary system: Secondary | ICD-10-CM | POA: Diagnosis not present

## 2024-05-26 DIAGNOSIS — M25512 Pain in left shoulder: Secondary | ICD-10-CM | POA: Diagnosis not present

## 2024-05-26 DIAGNOSIS — Z8546 Personal history of malignant neoplasm of prostate: Secondary | ICD-10-CM | POA: Diagnosis not present

## 2024-05-26 DIAGNOSIS — R300 Dysuria: Secondary | ICD-10-CM | POA: Diagnosis not present

## 2024-06-10 DIAGNOSIS — L570 Actinic keratosis: Secondary | ICD-10-CM | POA: Diagnosis not present

## 2024-06-10 DIAGNOSIS — L821 Other seborrheic keratosis: Secondary | ICD-10-CM | POA: Diagnosis not present

## 2024-06-23 DIAGNOSIS — R300 Dysuria: Secondary | ICD-10-CM | POA: Diagnosis not present

## 2024-06-23 DIAGNOSIS — Z8546 Personal history of malignant neoplasm of prostate: Secondary | ICD-10-CM | POA: Diagnosis not present

## 2024-06-23 DIAGNOSIS — N5201 Erectile dysfunction due to arterial insufficiency: Secondary | ICD-10-CM | POA: Diagnosis not present

## 2024-07-03 DIAGNOSIS — G5601 Carpal tunnel syndrome, right upper limb: Secondary | ICD-10-CM | POA: Diagnosis not present

## 2024-07-03 DIAGNOSIS — I10 Essential (primary) hypertension: Secondary | ICD-10-CM | POA: Diagnosis not present

## 2024-07-03 DIAGNOSIS — M48061 Spinal stenosis, lumbar region without neurogenic claudication: Secondary | ICD-10-CM | POA: Diagnosis not present

## 2024-07-03 DIAGNOSIS — M545 Low back pain, unspecified: Secondary | ICD-10-CM | POA: Diagnosis not present

## 2024-07-23 DIAGNOSIS — I4891 Unspecified atrial fibrillation: Secondary | ICD-10-CM | POA: Diagnosis not present

## 2024-07-23 DIAGNOSIS — G629 Polyneuropathy, unspecified: Secondary | ICD-10-CM | POA: Diagnosis not present

## 2024-07-23 DIAGNOSIS — I10 Essential (primary) hypertension: Secondary | ICD-10-CM | POA: Diagnosis not present

## 2024-07-23 DIAGNOSIS — Z8673 Personal history of transient ischemic attack (TIA), and cerebral infarction without residual deficits: Secondary | ICD-10-CM | POA: Diagnosis not present

## 2024-07-23 DIAGNOSIS — R7303 Prediabetes: Secondary | ICD-10-CM | POA: Diagnosis not present

## 2024-07-23 DIAGNOSIS — Z1331 Encounter for screening for depression: Secondary | ICD-10-CM | POA: Diagnosis not present

## 2024-07-23 DIAGNOSIS — I739 Peripheral vascular disease, unspecified: Secondary | ICD-10-CM | POA: Diagnosis not present

## 2024-07-23 DIAGNOSIS — Z9181 History of falling: Secondary | ICD-10-CM | POA: Diagnosis not present

## 2024-07-23 DIAGNOSIS — E7800 Pure hypercholesterolemia, unspecified: Secondary | ICD-10-CM | POA: Diagnosis not present

## 2024-07-30 DIAGNOSIS — R220 Localized swelling, mass and lump, head: Secondary | ICD-10-CM | POA: Diagnosis not present

## 2024-07-30 DIAGNOSIS — M25512 Pain in left shoulder: Secondary | ICD-10-CM | POA: Diagnosis not present

## 2024-07-30 DIAGNOSIS — M7500 Adhesive capsulitis of unspecified shoulder: Secondary | ICD-10-CM | POA: Diagnosis not present

## 2024-07-30 DIAGNOSIS — R29898 Other symptoms and signs involving the musculoskeletal system: Secondary | ICD-10-CM | POA: Diagnosis not present

## 2024-08-15 DIAGNOSIS — M6281 Muscle weakness (generalized): Secondary | ICD-10-CM | POA: Diagnosis not present

## 2024-08-15 DIAGNOSIS — R2689 Other abnormalities of gait and mobility: Secondary | ICD-10-CM | POA: Diagnosis not present

## 2024-08-18 DIAGNOSIS — M6281 Muscle weakness (generalized): Secondary | ICD-10-CM | POA: Diagnosis not present

## 2024-08-18 DIAGNOSIS — R2689 Other abnormalities of gait and mobility: Secondary | ICD-10-CM | POA: Diagnosis not present

## 2024-08-21 DIAGNOSIS — M6281 Muscle weakness (generalized): Secondary | ICD-10-CM | POA: Diagnosis not present

## 2024-08-21 DIAGNOSIS — R2689 Other abnormalities of gait and mobility: Secondary | ICD-10-CM | POA: Diagnosis not present

## 2024-08-27 DIAGNOSIS — M6281 Muscle weakness (generalized): Secondary | ICD-10-CM | POA: Diagnosis not present

## 2024-08-27 DIAGNOSIS — R2689 Other abnormalities of gait and mobility: Secondary | ICD-10-CM | POA: Diagnosis not present

## 2024-08-29 DIAGNOSIS — R2689 Other abnormalities of gait and mobility: Secondary | ICD-10-CM | POA: Diagnosis not present

## 2024-08-29 DIAGNOSIS — M6281 Muscle weakness (generalized): Secondary | ICD-10-CM | POA: Diagnosis not present

## 2024-09-01 DIAGNOSIS — R2689 Other abnormalities of gait and mobility: Secondary | ICD-10-CM | POA: Diagnosis not present

## 2024-09-01 DIAGNOSIS — M6281 Muscle weakness (generalized): Secondary | ICD-10-CM | POA: Diagnosis not present

## 2024-09-04 DIAGNOSIS — R2689 Other abnormalities of gait and mobility: Secondary | ICD-10-CM | POA: Diagnosis not present

## 2024-09-04 DIAGNOSIS — M6281 Muscle weakness (generalized): Secondary | ICD-10-CM | POA: Diagnosis not present

## 2024-09-08 DIAGNOSIS — M6281 Muscle weakness (generalized): Secondary | ICD-10-CM | POA: Diagnosis not present

## 2024-09-08 DIAGNOSIS — R2689 Other abnormalities of gait and mobility: Secondary | ICD-10-CM | POA: Diagnosis not present

## 2024-09-11 DIAGNOSIS — M6281 Muscle weakness (generalized): Secondary | ICD-10-CM | POA: Diagnosis not present

## 2024-09-11 DIAGNOSIS — R2689 Other abnormalities of gait and mobility: Secondary | ICD-10-CM | POA: Diagnosis not present

## 2024-09-23 DIAGNOSIS — R2689 Other abnormalities of gait and mobility: Secondary | ICD-10-CM | POA: Diagnosis not present

## 2024-09-23 DIAGNOSIS — M6281 Muscle weakness (generalized): Secondary | ICD-10-CM | POA: Diagnosis not present

## 2024-09-25 DIAGNOSIS — R2689 Other abnormalities of gait and mobility: Secondary | ICD-10-CM | POA: Diagnosis not present

## 2024-09-25 DIAGNOSIS — M6281 Muscle weakness (generalized): Secondary | ICD-10-CM | POA: Diagnosis not present

## 2024-09-29 DIAGNOSIS — R2689 Other abnormalities of gait and mobility: Secondary | ICD-10-CM | POA: Diagnosis not present

## 2024-09-29 DIAGNOSIS — M6281 Muscle weakness (generalized): Secondary | ICD-10-CM | POA: Diagnosis not present

## 2024-10-02 DIAGNOSIS — M6281 Muscle weakness (generalized): Secondary | ICD-10-CM | POA: Diagnosis not present

## 2024-10-02 DIAGNOSIS — R2689 Other abnormalities of gait and mobility: Secondary | ICD-10-CM | POA: Diagnosis not present

## 2024-10-09 DIAGNOSIS — R2689 Other abnormalities of gait and mobility: Secondary | ICD-10-CM | POA: Diagnosis not present

## 2024-10-09 DIAGNOSIS — M6281 Muscle weakness (generalized): Secondary | ICD-10-CM | POA: Diagnosis not present

## 2024-10-14 DIAGNOSIS — R2689 Other abnormalities of gait and mobility: Secondary | ICD-10-CM | POA: Diagnosis not present

## 2024-10-14 DIAGNOSIS — M6281 Muscle weakness (generalized): Secondary | ICD-10-CM | POA: Diagnosis not present

## 2024-10-21 DIAGNOSIS — M6281 Muscle weakness (generalized): Secondary | ICD-10-CM | POA: Diagnosis not present

## 2024-10-21 DIAGNOSIS — R2689 Other abnormalities of gait and mobility: Secondary | ICD-10-CM | POA: Diagnosis not present

## 2024-10-29 DIAGNOSIS — R2689 Other abnormalities of gait and mobility: Secondary | ICD-10-CM | POA: Diagnosis not present

## 2024-10-29 DIAGNOSIS — M6281 Muscle weakness (generalized): Secondary | ICD-10-CM | POA: Diagnosis not present

## 2024-11-03 DIAGNOSIS — M6281 Muscle weakness (generalized): Secondary | ICD-10-CM | POA: Diagnosis not present

## 2024-11-03 DIAGNOSIS — R2689 Other abnormalities of gait and mobility: Secondary | ICD-10-CM | POA: Diagnosis not present

## 2024-11-06 DIAGNOSIS — M6281 Muscle weakness (generalized): Secondary | ICD-10-CM | POA: Diagnosis not present

## 2024-11-06 DIAGNOSIS — R2689 Other abnormalities of gait and mobility: Secondary | ICD-10-CM | POA: Diagnosis not present

## 2024-11-13 DIAGNOSIS — R399 Unspecified symptoms and signs involving the genitourinary system: Secondary | ICD-10-CM | POA: Diagnosis not present

## 2024-11-13 DIAGNOSIS — R319 Hematuria, unspecified: Secondary | ICD-10-CM | POA: Diagnosis not present

## 2024-11-13 DIAGNOSIS — Z8546 Personal history of malignant neoplasm of prostate: Secondary | ICD-10-CM | POA: Diagnosis not present

## 2024-11-27 ENCOUNTER — Other Ambulatory Visit: Payer: Self-pay | Admitting: Vascular Surgery
